# Patient Record
Sex: Female | Born: 1940 | Race: White | Hispanic: No | Marital: Married | Smoking: Former smoker
Health system: Southern US, Community
[De-identification: ages and names within clinical notes are randomized; demographics above are authoritative.]

## PROBLEM LIST (undated history)

## (undated) DIAGNOSIS — E7800 Pure hypercholesterolemia, unspecified: Secondary | ICD-10-CM

## (undated) DIAGNOSIS — H269 Unspecified cataract: Secondary | ICD-10-CM

## (undated) DIAGNOSIS — C801 Malignant (primary) neoplasm, unspecified: Secondary | ICD-10-CM

## (undated) DIAGNOSIS — M1990 Unspecified osteoarthritis, unspecified site: Secondary | ICD-10-CM

## (undated) DIAGNOSIS — I10 Essential (primary) hypertension: Secondary | ICD-10-CM

## (undated) DIAGNOSIS — K219 Gastro-esophageal reflux disease without esophagitis: Secondary | ICD-10-CM

## (undated) DIAGNOSIS — T7840XA Allergy, unspecified, initial encounter: Secondary | ICD-10-CM

## (undated) HISTORY — DX: Gastro-esophageal reflux disease without esophagitis: K21.9

## (undated) HISTORY — DX: Malignant (primary) neoplasm, unspecified: C80.1

## (undated) HISTORY — DX: Allergy, unspecified, initial encounter: T78.40XA

## (undated) HISTORY — PX: KNEE SURGERY: SHX244

## (undated) HISTORY — PX: BREAST SURGERY: SHX581

## (undated) HISTORY — PX: ABDOMINAL HYSTERECTOMY: SHX81

## (undated) HISTORY — DX: Unspecified cataract: H26.9

## (undated) HISTORY — DX: Unspecified osteoarthritis, unspecified site: M19.90

---

## 2008-11-23 ENCOUNTER — Ambulatory Visit: Payer: Self-pay | Admitting: Family Medicine

## 2008-11-23 DIAGNOSIS — N3941 Urge incontinence: Secondary | ICD-10-CM | POA: Insufficient documentation

## 2008-11-23 DIAGNOSIS — Z780 Asymptomatic menopausal state: Secondary | ICD-10-CM | POA: Insufficient documentation

## 2008-11-23 DIAGNOSIS — E041 Nontoxic single thyroid nodule: Secondary | ICD-10-CM | POA: Insufficient documentation

## 2008-11-24 ENCOUNTER — Encounter: Payer: Self-pay | Admitting: Family Medicine

## 2008-11-24 LAB — CONVERTED CEMR LAB
ALT: 16 units/L (ref 0–35)
AST: 20 units/L (ref 0–37)
Albumin: 4.5 g/dL (ref 3.5–5.2)
Alkaline Phosphatase: 62 units/L (ref 39–117)
BUN: 19 mg/dL (ref 6–23)
CO2: 24 meq/L (ref 19–32)
Calcium: 10.4 mg/dL (ref 8.4–10.5)
Chloride: 104 meq/L (ref 96–112)
Cholesterol: 211 mg/dL — ABNORMAL HIGH (ref 0–200)
Creatinine, Ser: 0.86 mg/dL (ref 0.40–1.20)
Glucose, Bld: 90 mg/dL (ref 70–99)
HDL: 53 mg/dL (ref >39)
Potassium: 4.1 meq/L (ref 3.5–5.3)
Sodium: 142 meq/L (ref 135–145)
TSH: 1.687 microintl units/mL (ref 0.350–4.500)
Total Bilirubin: 0.5 mg/dL (ref 0.3–1.2)
Total CHOL/HDL Ratio: 4
Total Protein: 7.1 g/dL (ref 6.0–8.3)
Triglycerides: 417 mg/dL — ABNORMAL HIGH (ref <150)

## 2008-11-27 LAB — CONVERTED CEMR LAB: Direct LDL: 111 mg/dL — ABNORMAL HIGH

## 2008-12-01 ENCOUNTER — Encounter: Admission: RE | Admit: 2008-12-01 | Discharge: 2008-12-01 | Payer: Self-pay | Admitting: Family Medicine

## 2009-01-15 ENCOUNTER — Telehealth (INDEPENDENT_AMBULATORY_CARE_PROVIDER_SITE_OTHER): Payer: Self-pay | Admitting: *Deleted

## 2009-03-05 ENCOUNTER — Telehealth: Payer: Self-pay | Admitting: Family Medicine

## 2009-03-14 ENCOUNTER — Encounter: Admission: RE | Admit: 2009-03-14 | Discharge: 2009-03-14 | Payer: Self-pay | Admitting: Family Medicine

## 2010-02-14 NOTE — Assessment & Plan Note (Signed)
Summary: NOV: CPE   Vital Signs:  Patient profile:   70 year old Kendra [name] Height:      66.2 inches Weight:      155 pounds BMI:     24.96 Pulse rate:   85 / minute BP sitting:   122 / 77  (left arm) Cuff size:   regular  Vitals Entered By: Kathlene November (November 23, 2008 8:44 AM) CC: Np- CPE needs refills Is Patient Diabetic? No   CC:  Np- CPE needs refills.  History of Present Illness: TAking OTC fish oil a day for 2 weeks because can't afford the lovaza.  Also taking citrical two times a day in additon to 4000iu of vitamin D.   Habits & Providers  Alcohol-Tobacco-Diet     Alcohol drinks/day: 1     Tobacco Status: quit     Year Quit: 35 years ago  Exercise-Depression-Behavior     Does Patient Exercise: no     STD Risk: never     Drug Use: no     Seat Belt Use: always  Current Medications (verified): 1)  Losartan Potassium-Hctz 100-25 Mg Tabs (Losartan Potassium-Hctz) .... Take One Tablet By Mouth Once A Day 2)  Lipitor 40 Mg Tabs (Atorvastatin Calcium) .... Take One Tablet By Mouth Once Aday 3)  Lovaza 1 Gm Caps (Omega-3-Acid Ethyl Esters) .... Take One Tablet By Mouth Once A Day 4)  Claritin 10 Mg Tabs (Loratadine) .... Take One Tablet By Mouth Once A Day 5)  Pepcid 20 Mg Tabs (Famotidine) .... Take One Tablet By Mouth Once A Day 6)  Citracal Plus  Tabs (Multiple Minerals-Vitamins) .... Take One Tablet By Mouth Twice A Day  Allergies (verified): 1)  ! Morphine  Comments:  Nurse/Medical Assistant: The patient's medications and allergies were reviewed with the patient and were updated in the Medication and Allergy Lists. Kathlene November (November 23, 2008 8:49 AM)  Past History:  Past Medical History: Hearing loss wears bilat hearing aids.   Past Surgical History: Left  knee surgery, Torn ACL Complet3e Hysterectomy 1995, for uterine polyps Right ankle surgery   Family History: Father with colon Ca, Sister wtih BrCa Mother with MI, DM Father with  stroke  Social History: [employer].  Assoc degree.  Married to [employer] with one daughter.   Former Smoker Alcohol use-yes, 1-2 glasses of wine per day.  Drug use-no Regular exercise-no No caffeine. Smoking Status:  quit Does Patient Exercise:  no STD Risk:  never Drug Use:  no Seat Belt Use:  always  Review of Systems       No fever/sweats/weakness, unexplained weight loss/gain.  No vison changes.  No difficulty hearing/ringing in ears, hay fever/allergies.  No chest pain/discomfort, palpitations.  No Br lump/nipple discharge.  No cough/wheeze.  No blood in BM, nausea/vomiting/diarrhea.  No nighttime urination, leaking urine, unusual vaginal bleeding, discharge (penis or vagina).  No muscle/joint pain. No rash, change in mole.  No HA, memory loss.  No anxiety, sleep d/o, depression.  No easy bruising/bleeding, unexplained lump   Physical Exam  General:  Well-developed,well-nourished,in no acute distress; alert,appropriate and cooperative throughout examination Head:  Normocephalic and atraumatic without obvious abnormalities. No apparent alopecia or balding. Eyes:  No corneal or conjunctival inflammation noted. EOMI. Perrla.WEars glasses.  Ears:  External ear exam shows no significant lesions or deformities.  Otoscopic examination reveals clear canals, tympanic membranes are intact bilaterally without bulging, retraction, inflammation or discharge. Hearing is grossly normal bilaterally. Nose:  External nasal examination shows no  deformity or inflammation. Nasal mucosa are pink and moist without lesions or exudates. Mouth:  Oral mucosa and oropharynx without lesions or exudates.  Teeth in good repair. Neck:  No deformities, masses, or tenderness noted. I feel a nodule on the right side of the thyroid gland near the middle.  Nontender.  Chest Wall:  No deformities, masses, or tenderness noted. Breasts:  No mass, nodules, thickening, tenderness, bulging, retraction, inflamation, nipple discharge  or skin changes noted.   Lungs:  Normal respiratory effort, chest expands symmetrically. Lungs are clear to auscultation, no crackles or wheezes. Heart:  Normal rate and regular rhythm. S1 and S2 normal without gallop, murmur, click, rub or other extra sounds. Abdomen:  Bowel sounds positive,abdomen soft and non-tender without masses, organomegaly or hernias noted. Msk:  No deformity or scoliosis noted of thoracic or lumbar spine.   Pulses:  R and L carotid,radial,dorsalis pedis and posterior tibial pulses are full and equal bilaterally Extremities:  No clubbing, cyanosis, edema, or deformity noted with normal full range of motion of all joints.   Neurologic:  No cranial nerve deficits noted. Station and gait are normal. DTRs are symmetrical throughout. Sensory, motor and coordinative functions appear intact. Skin:  no rashes.   Cervical Nodes:  No lymphadenopathy noted Axillary Nodes:  No palpable lymphadenopathy Psych:  Cognition and judgment appear intact. Alert and cooperative with normal attention span and concentration. No apparent delusions, illusions, hallucinations   Impression & Recommendations:  Problem # 1:  HEALTH MAINTENANCE EXAM (ICD-V70.0) Due for DEXA and mammogram. Order form given. Labs slip given for screening labs.  Can decrease her vitamin D intake to 2000iu once a day as this really should be more than adequate.  Continue her calcium.  She has already had her flu shot. Pneumococcal given today. We should get shingles in Jan and can update her at that point in time.  Encourage more regularly exercise.  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  Orders: T-TSH [phone]) T-*Unlisted Diagnostic X-ray test/procedure [phone])  Complete Medication List: 1)  Losartan Potassium-hctz 100-25 Mg Tabs (Losartan potassium-hctz) ....  Take one tablet by mouth once a day 2)  Lipitor 40 Mg Tabs (Atorvastatin calcium) .... Take one tablet by mouth once aday 3)  Lovaza 1 Gm Caps (Omega-3-acid ethyl esters) .... Take 4 tablet by mouth once a day 4)  Claritin 10 Mg Tabs (Loratadine) .... Take one tablet by mouth once a day 5)  Pepcid 20 Mg Tabs (Famotidine) .... Take one tablet by mouth once a day 6)  Citracal Plus Tabs (Multiple minerals-vitamins) .... Take one tablet by mouth twice a day  Other Orders: T-Dual DXA Bone Density/ Axial (96295) Pneumococcal Vaccine (28413) Admin 1st Vaccine (24401) Urology Referral (Urology) Prescriptions: LOVAZA 1 GM CAPS (OMEGA-3-ACID ETHYL ESTERS) take 4 tablet by mouth once a day  #360 x 1   Entered and Authorized by:   Nani Gasser MD   Signed by:   Nani Gasser MD on 11/23/2008   Method used:   Electronically to        [facility]  [address] (retail)       [address]       [place], [place]  [address]       Ph: [phone]       Fax: [phone]   RxID:   [id number] LIPITOR 40 MG TABS (ATORVASTATIN  CALCIUM) Take one tablet by mouth once aday  #90 x 1   Entered and Authorized by:   Nani Gasser MD   Signed by:   Nani Gasser MD on 11/23/2008   Method used:   Electronically to        [facility]  [address] (retail)       [address]       [place], [place]  [address]       Ph: [phone]       Fax: [phone]   RxID:   [id number] LOSARTAN POTASSIUM-HCTZ 100-25 MG TABS (LOSARTAN POTASSIUM-HCTZ) take one tablet by mouth once a day  #90 x 1   Entered and Authorized by:   Nani Gasser MD   Signed by:   Nani Gasser MD on 11/23/2008   Method used:   Electronically to        [facility]  [address] (retail)       [address]       [place], [place]  [address]       Ph: [phone]       Fax: [phone]   RxID:   [id number]   Flu Vaccine Result Date:  10/22/2008 Flu Vaccine Result:  given Flu Vaccine Next Due:  1  yr PAP Next Due:  Not Indicated   Immunizations Administered:  Pneumonia Vaccine:    Vaccine Type: Pneumovax    Site: right deltoid    Mfr: Merck    Dose: 0.5 ml    Route: IM    Given by: Kathlene November    Exp. Date: 12/23/2009    Lot #: [id number]    VIS given: 08/11/95 version given November 23, 2008.

## 2010-02-14 NOTE — Progress Notes (Signed)
Summary: Due for repeat US thyroid  Phone Note Outgoing Call   Summary of Call: Call pt: Due for repeat thyroid US> Initial call taken by: Nani Gasser MD,  March 05, 2009 10:54 AM  Follow-up for Phone Call        faxed order to [facility] to be scheduled. They will contact patient Follow-up by: Kathlene November,  March 05, 2009 11:03 AM

## 2010-02-14 NOTE — Progress Notes (Signed)
----   Converted from flag ---- ---- 01/15/2009 9:45 AM, Nani Gasser MD wrote: Can you cal pt and let her know we have the vaccines in if she wants to schedule a nurse visit to get it.   ---- 11/23/2008 9:16 AM, Nani Gasser MD wrote: Needs shingles vaccine. ------------------------------  Pt notified of above. KJ LPN 01/18/1094

## 2010-02-25 ENCOUNTER — Encounter: Payer: Self-pay | Admitting: Family Medicine

## 2010-02-26 ENCOUNTER — Encounter: Payer: Self-pay | Admitting: Family Medicine

## 2010-02-27 ENCOUNTER — Telehealth: Payer: Self-pay | Admitting: Family Medicine

## 2010-03-06 NOTE — Procedures (Signed)
Summary: Colonoscopy    Preventive Care Screening  Colonoscopy:    Date:  02/25/2010    Next Due:  02/2015    Results:  normal Performed at [facility]

## 2010-03-06 NOTE — Progress Notes (Signed)
Summary: Neds appt  ---- Converted from flag ---- ---- 02/27/2010 11:07 AM, Janeal Holmes wrote: Dr. Judie Petit,  I called this pt yesterday and left messg with husband for pt to call back and discuss appointment.  Thanks, [place]  ---- 02/26/2010 11:13 AM, Nani Gasser MD wrote: Needs appt. Hasn't been seen in over a year. ------------------------------

## 2010-03-19 ENCOUNTER — Encounter: Payer: Self-pay | Admitting: Family Medicine

## 2010-03-21 NOTE — Procedures (Signed)
Summary: Colonoscopy/[facility]  Colonoscopy/[facility]   Imported By: Lanelle Bal 03/14/2010 12:53:24  _____________________________________________________________________  External Attachment:    Type:   Image     Comment:   External Document

## 2010-03-26 ENCOUNTER — Encounter: Payer: Self-pay | Admitting: Family Medicine

## 2010-03-26 ENCOUNTER — Encounter (INDEPENDENT_AMBULATORY_CARE_PROVIDER_SITE_OTHER): Payer: Commercial Indemnity | Admitting: Family Medicine

## 2010-03-26 DIAGNOSIS — Z0000 Encounter for general adult medical examination without abnormal findings: Secondary | ICD-10-CM

## 2010-03-27 ENCOUNTER — Encounter: Payer: Self-pay | Admitting: Family Medicine

## 2010-03-28 LAB — CONVERTED CEMR LAB
ALT: 12 units/L (ref 0–35)
AST: 17 units/L (ref 0–37)
Albumin: 4.7 g/dL (ref 3.5–5.2)
Alkaline Phosphatase: 68 units/L (ref 39–117)
BUN: 16 mg/dL (ref 6–23)
CO2: 24 meq/L (ref 19–32)
Calcium: 9.5 mg/dL (ref 8.4–10.5)
Chloride: 102 meq/L (ref 96–112)
Cholesterol: 203 mg/dL — ABNORMAL HIGH (ref 0–200)
Creatinine, Ser: 0.79 mg/dL (ref 0.40–1.20)
Glucose, Bld: 91 mg/dL (ref 70–99)
HDL: 60 mg/dL (ref >39)
LDL Cholesterol: 102 mg/dL — ABNORMAL HIGH (ref 0–99)
Potassium: 3.7 meq/L (ref 3.5–5.3)
Sodium: 140 meq/L (ref 135–145)
TSH: 1.278 microintl units/mL (ref 0.350–4.500)
Total Bilirubin: 0.6 mg/dL (ref 0.3–1.2)
Total CHOL/HDL Ratio: 3.4
Total Protein: 7.5 g/dL (ref 6.0–8.3)
Triglycerides: 206 mg/dL — ABNORMAL HIGH (ref <150)
VLDL: 41 mg/dL — ABNORMAL HIGH (ref 0–40)

## 2010-04-01 ENCOUNTER — Telehealth: Payer: Self-pay | Admitting: Family Medicine

## 2010-04-02 NOTE — Assessment & Plan Note (Signed)
Summary: CPE   Vital Signs:  Patient profile:   70 year old female Height:      66.2 inches Weight:      155 pounds Pulse rate:   59 / minute BP sitting:   127 / 83  (right arm) Cuff size:   regular  Vitals Entered By: Avon Gully CMA, Duncan Dull) (March 26, 2010 11:07 AM) CC: CPE, no pap, would like paper rx   CC:  CPE, no pap, and would like paper rx.  History of Present Illness:  she is here for a complete physical without Pap.  Current Medications (verified): 1)  Losartan Potassium-Hctz 100-25 Mg Tabs (Losartan Potassium-Hctz) .... Take One Tablet By Mouth Once A Day 2)  Lipitor 40 Mg Tabs (Atorvastatin Calcium) .... Take One and One-Half Tablet By Mouth Once A Day 3)  Lovaza 1 Gm Caps (Omega-3-Acid Ethyl Esters) .... Take 4 Tablet By Mouth Once A Day 4)  Zyrtec Allergy 10 Mg Tabs (Cetirizine Hcl) .... Take One Table By Mouth Once A Day 5)  Pepcid 20 Mg Tabs (Famotidine) .... Take One Tablet By Mouth Once A Day 6)  Citracal Plus  Tabs (Multiple Minerals-Vitamins) .... Take One Tablet By Mouth Twice A Day  Allergies (verified): 1)  ! Morphine  Comments:  Nurse/Medical Assistant: The patient's medications and allergies were reviewed with the patient and were updated in the Medication and Allergy Lists. Avon Gully CMA, Duncan Dull) (March 26, 2010 11:08 AM)  Past History:  Past Medical History: Last updated: 11/23/2008 Hearing loss wears bilat hearing aids.   Past Surgical History: Last updated: 11/23/2008 Left  knee surgery, Torn ACL Complet3e Hysterectomy 1995, for uterine polyps Right ankle surgery   Family History: Father with colon Ca, lung Ca, COPD, stroke, living.  Sister wtih BrCa Mother with MI, DM, alzheimers died  Review of Systems  The patient denies anorexia, fever, weight loss, weight gain, vision loss, decreased hearing, hoarseness, chest pain, syncope, dyspnea on exertion, peripheral edema, prolonged cough, headaches, hemoptysis, abdominal  pain, melena, hematochezia, severe indigestion/heartburn, hematuria, incontinence, genital sores, muscle weakness, suspicious skin lesions, transient blindness, difficulty walking, depression, unusual weight change, abnormal bleeding, enlarged lymph nodes, and breast masses.    Physical Exam  General:  Well-developed,well-nourished,in no acute distress; alert,appropriate and cooperative throughout examination Head:  Normocephalic and atraumatic without obvious abnormalities. No apparent alopecia or balding. Eyes:  No corneal or conjunctival inflammation noted. EOMI. Perrla. Ears:  External ear exam shows no significant lesions or deformities.  Otoscopic examination reveals clear canals, tympanic membranes are intact bilaterally without bulging, retraction, inflammation or discharge. Hearing is grossly normal bilaterally. Nose:  External nasal examination shows no deformity or inflammation.  Mouth:  Oral mucosa and oropharynx without lesions or exudates.  Teeth in good repair. Neck:  No deformities, masses, or tenderness noted. Chest Wall:  No deformities, masses, or tenderness noted. Breasts:  No mass, nodules, thickening, tenderness, bulging, retraction, inflamation, nipple discharge or skin changes noted.   Lungs:  Normal respiratory effort, chest expands symmetrically. Lungs are clear to auscultation, no crackles or wheezes. Heart:  Normal rate and regular rhythm. S1 and S2 normal without gallop, murmur, click, rub or other extra sounds. Abdomen:  Bowel sounds positive,abdomen soft and non-tender without masses, organomegaly or hernias noted. Msk:  No deformity or scoliosis noted of thoracic or lumbar spine.   Pulses:  R and L carotid,radial,dorsalis pedis and posterior tibial pulses are full and equal bilaterally Extremities:  No clubbing, cyanosis, edema, or deformity noted  with normal full range of motion of all joints.   Neurologic:  No cranial nerve deficits noted. Station and gait are  normal. DTRs are symmetrical throughout. Sensory, motor and coordinative functions appear intact. Skin:  no rashes.   Cervical Nodes:  No lymphadenopathy noted Psych:  Cognition and judgment appear intact. Alert and cooperative with normal attention span and concentration. No apparent delusions, illusions, hallucinations   Impression & Recommendations:  Problem # 1:  HEALTH MAINTENANCE EXAM (ICD-V70.0) Exam is normal today Encouraged regular exercise.  Has scheduled bone density and mammogram for next week Given Tdap today Will give rx of for shingles vaccines.  REminded to take her calcium supplement.  Orders: T-Mammography Bilateral Screening ([address]109) T-Dual DXA Bone Density/ Axial (60454) T-Comprehensive Metabolic Panel (09811-91478) T-Lipid Profile (29562-13086) T-TSH [phone])  Has scheduled bone density and mammogram for next week Given Tdap today Will give rx of for shingles vaccines.  REminded to take her calcium supplement.  Orders: T-Mammography Bilateral Screening (28413) T-Dual DXA Bone Density/ Axial (24401) T-Comprehensive Metabolic Panel (02725-36644) T-Lipid Profile (03474-25956) T-TSH (38756-43329)  Complete Medication List: 1)  Losartan Potassium-hctz 100-25 Mg Tabs (Losartan potassium-hctz) .... Take one tablet by mouth once a day 2)  Lipitor 40 Mg Tabs (Atorvastatin calcium) .... Take one and one-half tablet by mouth once a day 3)  Lovaza 1 Gm Caps (Omega-3-acid ethyl esters) .... Take 4 tablet by mouth once a day 4)  Zyrtec Allergy 10 Mg Tabs (Cetirizine hcl) .... Take one table by mouth once a day 5)  Pepcid 20 Mg Tabs (Famotidine) .... Take one tablet by mouth once a day 6)  Citracal Plus Tabs (Multiple minerals-vitamins) .... Take one tablet by mouth twice a day 7)  Fluticasone Propionate 50 Mcg/act Susp (Fluticasone propionate) .Marland Kitchen.. 1 sprays in each nostril once a day. 8)  Zostavax 51884 Unt/0.24ml Solr (Zoster vaccine live) .... Inject [place] x  1  Patient Instructions: 1)  It is important that you exercise reguarly at least 20 minutes 5 times a week. If you develop chest pain, have severe difficulty breathing, or feel very tired, stop exercising immediately and seek medical attention.  2)  Take calcium +vitamin D daily.  3)  We will call you with your lab results.  Prescriptions: ZOSTAVAX [id number] UNT/0.65ML SOLR (ZOSTER VACCINE LIVE) inject [place] x 1  #1 x 0   Entered and Authorized by:   Nani Gasser MD   Signed by:   Nani Gasser MD on 03/26/2010   Method used:   Print then Give to Patient   RxID:   [id number] LOVAZA 1 GM CAPS (OMEGA-3-ACID ETHYL ESTERS) take 4 tablet by mouth once a day  #360 x 3   Entered and Authorized by:   Nani Gasser MD   Signed by:   Nani Gasser MD on 03/26/2010   Method used:   Print then Give to Patient   RxID:   [id number] LIPITOR 40 MG TABS (ATORVASTATIN CALCIUM) Take one and one-half tablet by mouth once a day  #90 x 3   Entered and Authorized by:   Nani Gasser MD   Signed by:   Nani Gasser MD on 03/26/2010   Method used:   Print then Give to Patient   RxID:   [id number] RSWNIOEV POTASSIUM-HCTZ 100-25 MG TABS (LOSARTAN POTASSIUM-HCTZ) take one tablet by mouth once a day  #90 x 3   Entered and Authorized by:   Nani Gasser MD   Signed by:   Nani Gasser MD on 03/26/2010  Method used:   Print then Give to Patient   RxID:   [id number] FLUTICASONE PROPIONATE 50 MCG/ACT SUSP (FLUTICASONE PROPIONATE) 1 sprays in each nostril once a day.  #3 x 3   Entered and Authorized by:   Nani Gasser MD   Signed by:   Nani Gasser MD on 03/26/2010   Method used:   Print then Give to Patient   RxID:   [id number]    Orders Added: 1)  T-Mammography Bilateral Screening [77057] 2)  T-Dual DXA Bone Density/ Axial [77080] 3)  T-Comprehensive Metabolic Panel [80053-22900] 4)  T-Lipid Profile [80061-22930] 5)  T-TSH  [84696-29528] 6)  Est. Patient age 33&> [41324]      Flex Sig Next Due:  Not Indicated Hemoccult Next Due:  Not Indicated

## 2010-04-11 NOTE — Progress Notes (Signed)
  Phone Note Call from Patient   Caller: Patient Call For: Nani Gasser MD Summary of Call: pt called on Friday and wanted a rx for itchy ears. States you guys had discussed it but then forgot to send in medication Initial call taken by: Avon Gully CMA, Duncan Dull),  April 01, 2010 10:57 AM    New/Updated Medications: ACETASOL HC 2-1 % SOLN (HYDROCORTISONE-ACETIC ACID) 2 gtt in each ear two times a day for one week. Prescriptions: ACETASOL HC 2-1 % SOLN (HYDROCORTISONE-ACETIC ACID) 2 gtt in each ear two times a day for one week.  #1 bottle x 0   Entered and Authorized by:   Nani Gasser MD   Signed by:   Nani Gasser MD on 04/01/2010   Method used:   Electronically to        [facility] (retail)       [address]       [place], [place]  [address]       Ph: [phone]       Fax: [phone]   RxID:   [id number]

## 2010-06-05 ENCOUNTER — Encounter: Payer: Self-pay | Admitting: Family Medicine

## 2010-12-26 ENCOUNTER — Other Ambulatory Visit: Payer: Self-pay | Admitting: Family Medicine

## 2011-04-10 ENCOUNTER — Other Ambulatory Visit: Payer: Self-pay | Admitting: Family Medicine

## 2011-04-19 ENCOUNTER — Other Ambulatory Visit: Payer: Self-pay | Admitting: Family Medicine

## 2011-04-22 ENCOUNTER — Other Ambulatory Visit: Payer: Self-pay | Admitting: Family Medicine

## 2011-04-29 DIAGNOSIS — J019 Acute sinusitis, unspecified: Secondary | ICD-10-CM | POA: Diagnosis not present

## 2011-04-29 DIAGNOSIS — R059 Cough, unspecified: Secondary | ICD-10-CM | POA: Diagnosis not present

## 2011-04-29 DIAGNOSIS — R05 Cough: Secondary | ICD-10-CM | POA: Diagnosis not present

## 2011-04-29 DIAGNOSIS — J309 Allergic rhinitis, unspecified: Secondary | ICD-10-CM | POA: Diagnosis not present

## 2011-04-30 ENCOUNTER — Other Ambulatory Visit: Payer: Self-pay | Admitting: Family Medicine

## 2011-05-01 ENCOUNTER — Telehealth: Payer: Self-pay | Admitting: *Deleted

## 2011-05-01 MED ORDER — FLUCONAZOLE 150 MG PO TABS: 150.0000 mg | ORAL_TABLET | Freq: Once | ORAL | Status: AC

## 2011-05-01 NOTE — Telephone Encounter (Signed)
Pt [facility] and [facility] a [facility] infection and was given abx. Pt states that she is going out of town for a month and would like to know if we can send her a diflucan to the pharmacy.

## 2011-05-01 NOTE — Telephone Encounter (Signed)
Pt informed and rx sent to pharmacy 

## 2011-05-01 NOTE — Telephone Encounter (Signed)
OK for diflucan 150mg  x 1. 0 RF. Try to f/u when back in town. She hasn't been seen in over a year.

## 2011-07-16 DIAGNOSIS — H903 Sensorineural hearing loss, bilateral: Secondary | ICD-10-CM | POA: Diagnosis not present

## 2011-07-19 ENCOUNTER — Other Ambulatory Visit: Payer: Self-pay | Admitting: Family Medicine

## 2011-07-21 NOTE — Telephone Encounter (Signed)
Must make appointment 

## 2011-08-11 ENCOUNTER — Other Ambulatory Visit: Payer: Self-pay | Admitting: Family Medicine

## 2011-08-11 NOTE — Telephone Encounter (Signed)
Must schedule office visit

## 2011-08-12 ENCOUNTER — Encounter: Payer: Self-pay | Admitting: Family Medicine

## 2011-08-12 ENCOUNTER — Ambulatory Visit (INDEPENDENT_AMBULATORY_CARE_PROVIDER_SITE_OTHER): Payer: Medicare Other | Admitting: Family Medicine

## 2011-08-12 VITALS — BP 124/83 | HR 63 | Ht 64.0 in | Wt 156.0 lb

## 2011-08-12 DIAGNOSIS — M2550 Pain in unspecified joint: Secondary | ICD-10-CM

## 2011-08-12 DIAGNOSIS — E042 Nontoxic multinodular goiter: Secondary | ICD-10-CM | POA: Insufficient documentation

## 2011-08-12 DIAGNOSIS — M79609 Pain in unspecified limb: Secondary | ICD-10-CM

## 2011-08-12 DIAGNOSIS — I10 Essential (primary) hypertension: Secondary | ICD-10-CM

## 2011-08-12 DIAGNOSIS — Z0000 Encounter for general adult medical examination without abnormal findings: Secondary | ICD-10-CM

## 2011-08-12 DIAGNOSIS — M25579 Pain in unspecified ankle and joints of unspecified foot: Secondary | ICD-10-CM

## 2011-08-12 DIAGNOSIS — M79643 Pain in unspecified hand: Secondary | ICD-10-CM

## 2011-08-12 DIAGNOSIS — E785 Hyperlipidemia, unspecified: Secondary | ICD-10-CM | POA: Diagnosis not present

## 2011-08-12 DIAGNOSIS — E041 Nontoxic single thyroid nodule: Secondary | ICD-10-CM | POA: Diagnosis not present

## 2011-08-12 NOTE — Progress Notes (Addendum)
Subjective:    Kendra [name] is a 71 y.o. female who presents for Medicare Annual/Subsequent preventive examination.  Preventive Screening-Counseling & Management  Tobacco History  Smoking status  . Former Smoker  Smokeless tobacco  . Not on file  Comment: quit 30 years ago     Problems Prior to Visit 1.   Current Problems (verified) Patient Active Problem List  Diagnosis  . THYROID NODULE  . INCONTINENCE, URGE  . POSTMENOPAUSAL STATUS    Medications Prior to Visit Current Outpatient Prescriptions on File Prior to Visit  Medication Sig Dispense Refill  . atorvastatin (LIPITOR) 40 MG tablet TAKE 1 AND 1/2 TABLET BY MOUTH ONCE DAILY  45 tablet  0  . CALCIUM PO Take by mouth.      . Famotidine (PEPCID PO) Take by mouth.      . fluticasone (FLONASE) 50 MCG/ACT nasal spray INHALE 1 SPRAY IN EACH NOSTRIL ONCE DAILY  48 g  2  . losartan-hydrochlorothiazide (HYZAAR) 100-25 MG per tablet TAKE 1 TABLET BY MOUTH EVERY DAY  90 tablet  3  . LOVAZA 1 G capsule TAKE 4 CAPSULES BY MOUTH EVERY DAY  360 capsule  3    Current Medications (verified) Current Outpatient Prescriptions  Medication Sig Dispense Refill  . aspirin 81 MG tablet Take 81 mg by mouth daily.      Marland Kitchen atorvastatin (LIPITOR) 40 MG tablet TAKE 1 AND 1/2 TABLET BY MOUTH ONCE DAILY  45 tablet  0  . CALCIUM PO Take by mouth.      . Famotidine (PEPCID PO) Take by mouth.      . FIBER SELECT GUMMIES PO Take by mouth.      . fluticasone (FLONASE) 50 MCG/ACT nasal spray INHALE 1 SPRAY IN EACH NOSTRIL ONCE DAILY  48 g  2  . losartan-hydrochlorothiazide (HYZAAR) 100-25 MG per tablet TAKE 1 TABLET BY MOUTH EVERY DAY  90 tablet  3  . LOVAZA 1 G capsule TAKE 4 CAPSULES BY MOUTH EVERY DAY  360 capsule  3     Allergies (verified) Morphine   PAST HISTORY  Family History No family history on file.  Social History History  Substance Use Topics  . Smoking status: Former Games developer  . Smokeless tobacco: Not on file   Comment:  quit 30 years ago  . Alcohol Use: Not on file     Are there smokers in your home (other than you)? No  Risk Factors Current exercise habits: walks 3 days per week  Dietary issues discussed: no concerns    Cardiac risk factors: advanced age (older than 96 for men, 81 for women) and obesity (BMI >= 30 kg/m2).  Depression Screen (Note: if answer to either of the following is "Yes", a more complete depression screening is indicated)   Over the past two weeks, have you felt down, depressed or hopeless? No  Over the past two weeks, have you felt little interest or pleasure in doing things? No  Have you lost interest or pleasure in daily life? No  Do you often feel hopeless? No  Do you cry easily over simple problems? No  Activities of Daily Living In your present state of health, do you have any difficulty performing the following activities?:  Driving? No Managing money?  No Feeding yourself? No Getting from bed to chair? No Climbing a flight of stairs? No Preparing food and eating?: No Bathing or showering? No Getting dressed: No Getting to the toilet? No Using the toilet:No Moving around  from place to place: No In the past year have you fallen or had a near fall?:No   Are you sexually active?  Yes  Do you have more than one partner?  No  Hearing Difficulties: Yes Do you often ask people to speak up or repeat themselves? Yes Do you experience ringing or noises in your ears? No Do you have difficulty understanding soft or whispered voices? Yes   Do you feel that you have a problem with memory? No  Do you often misplace items? No  Do you feel safe at home?  Yes  Cognitive Testing  Alert? Yes  Normal Appearance?Yes  Oriented to person? Yes  Place? Yes   Time? Yes  Recall of three objects?  Yes  Can perform simple calculations? Yes  Displays appropriate judgment?Yes  Can read the correct time from a watch face?Yes   Advanced Directives have been discussed with the  patient? Yes  List the Names of Other Physician/Practitioners you currently use: 1.  Dr. Noe Gens for the colonoscopy.   Indicate any recent Medical Services you may have received from other than [facility] providers in the past year (date may be approximate).  Immunization History  Administered Date(s) Administered  . Influenza Whole 10/22/2008  . Pneumococcal Polysaccharide 11/23/2008    Screening Tests Health Maintenance  Topic Date Due  . Influenza Vaccine  10/14/2011  . Mammogram  04/30/2012  . Tetanus/tdap  01/14/2020  . Colonoscopy  02/14/2020  . Pneumococcal Polysaccharide Vaccine Age 68 And Over  Completed  . Zostavax  Addressed    All answers were reviewed with the patient and necessary referrals were made:  Kendra Cozzolino, MD   08/12/2011   History reviewed: allergies, current medications, past family history, past medical history, past social history, past surgical history and problem list  Review of Systems A comprehensive review of systems was negative.    Objective:     Vision by Snellen chart: right eye:20/30, left eye:20/30  Body mass index is 26.78 kg/(m^2). BP 124/83  Pulse 63  Ht 5\' 4"  (1.626 m)  Wt 156 lb (70.761 kg)  BMI 26.78 kg/m2  BP 124/83  Pulse 63  Ht 5\' 4"  (1.626 m)  Wt 156 lb (70.761 kg)  BMI 26.78 kg/m2  General Appearance:    Alert, cooperative, no distress, appears stated age  Head:    Normocephalic, without obvious abnormality, atraumatic  Eyes:    PERRL, conjunctiva/corneas clear, EOM's intact, both eyes  Ears:    Normal TM's and external ear canals, both ears  Nose:   Nares normal, septum midline, mucosa normal, no drainage    or sinus tenderness  Throat:   Lips, mucosa, and tongue normal; teeth and gums normal  Neck:   Supple, symmetrical, trachea midline, no adenopathy;    thyroid:  Thyroid is slightly larger on the right, with a palpable nodule; no carotid bruits or JVD  Back:     Symmetric, no curvature, ROM normal, no CVA  tenderness  Lungs:     Clear to auscultation bilaterally, respirations unlabored  Chest Wall:    No tenderness or deformity   Heart:    Regular rate and rhythm, S1 and S2 normal, no murmur, rub   or gallop  Breast Exam:    No tenderness, masses, or nipple abnormality  Abdomen:     Soft, non-tender, bowel sounds active all four quadrants,    no masses, no organomegaly  Genitalia:    Not preformed  Rectal:    Norr  performed  Extremities:   Extremities normal, atraumatic, no cyanosis or edema  Pulses:   2+ and symmetric all extremities  Skin:   Skin color, texture, turgor normal, no rashes or lesions  Lymph nodes:   Cervical, supraclavicular, and axillary nodes normal  Neurologic:   CNII-XII intact, normal strength, sensation and reflexes    throughout       Assessment:     Annual Wellness Exam, Medicare     Plan:     During the course of the visit the patient was educated and counseled about appropriate screening and preventive services including:    Screening mammography - she has scheduled in August.  Hearing Loss - Already wears hearing aids.    Vaccines up to date.   Diet review for nutrition referral? Yes ____  Not Indicated __x__   Patient Instructions (the written plan) was given to the patient.  Medicare Attestation I have personally reviewed: The patient's medical and social history Their use of alcohol, tobacco or illicit drugs Their current medications and supplements The patient's functional ability including ADLs,fall risks, home safety risks, cognitive, and hearing and visual impairment Diet and physical activities Evidence for depression or mood disorders  The patient's weight, height, BMI, and visual acuity have been recorded in the chart.  I have made referrals, counseling, and provided education to the patient based on review of the above and I have provided the patient with a written personalized care plan for preventive services.      Cruzito Standre, MD   08/12/2011       Right hand pain  - She also complains of pain especially in her right hand. She has some swelling over the MCPs. She says a couple years ago she had a doctor tell her that looked similar to rheumatoid but says that when they worked it up it was not rheumatoid. She says that was at least 10 years ago. She does have some significant swelling over the second and third MCPs. Some slight bogginess as well as tenderness. She says she typically has no problems with her left hand. I would like to check an ANA, rheumatoid factor and CBC today. We will also get x-rays.  Right ankle pain - She also has pain in her right ankle. She said she had a spiral fracture back in 1995 and once in a cast up to her hip at that time. She says the last several years she's had some pain and discomfort near the lateral malleolus. Especially right below the bone itself. She says it has been bothering her more lately and she has noticed an increase in swelling in that area. She would also like to get an x-ray of this area just to see if there is any significant changes or arthritis and the foot. She tries to rest and elevate it when it bothers her and this does seem to help. She does not usually use pain relievers for it.  Thyroid nodules history multinodular goiter-on exam today the right ureter does fill slightly larger so would like to get a repeat ultrasound since her last was 2 years ago just to make sure the lesions themselves are stable. We'll also check a TSH.  Hypertension-well-controlled. Due for CMP and fasting lipid panel today.

## 2011-08-12 NOTE — Patient Instructions (Addendum)
Start a regular exercise program and make sure you are eating a healthy diet Try to eat 4 servings of dairy a day Your vaccines are up to date.  We will call you with your lab results. If you don't here from [place] in about a week then please give [place] a call at [phone]. We will call you with your xray results too.

## 2011-08-13 ENCOUNTER — Ambulatory Visit (INDEPENDENT_AMBULATORY_CARE_PROVIDER_SITE_OTHER): Payer: Medicare Other

## 2011-08-13 DIAGNOSIS — M25579 Pain in unspecified ankle and joints of unspecified foot: Secondary | ICD-10-CM

## 2011-08-13 DIAGNOSIS — M19049 Primary osteoarthritis, unspecified hand: Secondary | ICD-10-CM | POA: Diagnosis not present

## 2011-08-13 DIAGNOSIS — R29898 Other symptoms and signs involving the musculoskeletal system: Secondary | ICD-10-CM

## 2011-08-13 DIAGNOSIS — M79643 Pain in unspecified hand: Secondary | ICD-10-CM

## 2011-08-13 DIAGNOSIS — M19079 Primary osteoarthritis, unspecified ankle and foot: Secondary | ICD-10-CM | POA: Diagnosis not present

## 2011-08-13 LAB — COMPLETE METABOLIC PANEL WITH GFR
ALT: 16 U/L (ref 0–35)
AST: 22 U/L (ref 0–37)
Albumin: 4.3 g/dL (ref 3.5–5.2)
Alkaline Phosphatase: 70 U/L (ref 39–117)
BUN: 15 mg/dL (ref 6–23)
CO2: 27 mEq/L (ref 19–32)
Calcium: 10.1 mg/dL (ref 8.4–10.5)
Chloride: 105 mEq/L (ref 96–112)
Creat: 0.85 mg/dL (ref 0.50–1.10)
GFR, Est African American: 81 mL/min
GFR, Est Non African American: 71 mL/min
Glucose, Bld: 93 mg/dL (ref 70–99)
Potassium: 4.2 mEq/L (ref 3.5–5.3)
Sodium: 141 mEq/L (ref 135–145)
Total Bilirubin: 0.6 mg/dL (ref 0.3–1.2)
Total Protein: 6.9 g/dL (ref 6.0–8.3)

## 2011-08-13 LAB — CBC WITH DIFFERENTIAL/PLATELET
Basophils Absolute: 0 10*3/uL (ref 0.0–0.1)
Basophils Relative: 1 % (ref 0–1)
Eosinophils Absolute: 0.2 10*3/uL (ref 0.0–0.7)
Eosinophils Relative: 3 % (ref 0–5)
HCT: 38.6 % (ref 36.0–46.0)
Hemoglobin: 13.1 g/dL (ref 12.0–15.0)
Lymphocytes Relative: 27 % (ref 12–46)
Lymphs Abs: 1.3 10*3/uL (ref 0.7–4.0)
MCH: 32.3 pg (ref 26.0–34.0)
MCHC: 33.9 g/dL (ref 30.0–36.0)
MCV: 95.1 fL (ref 78.0–100.0)
Monocytes Absolute: 0.4 10*3/uL (ref 0.1–1.0)
Monocytes Relative: 8 % (ref 3–12)
Neutro Abs: 2.9 10*3/uL (ref 1.7–7.7)
Neutrophils Relative %: 61 % (ref 43–77)
Platelets: 220 10*3/uL (ref 150–400)
RBC: 4.06 MIL/uL (ref 3.87–5.11)
RDW: 13.7 % (ref 11.5–15.5)
WBC: 4.8 10*3/uL (ref 4.0–10.5)

## 2011-08-13 LAB — LIPID PANEL
Cholesterol: 190 mg/dL (ref 0–200)
HDL: 67 mg/dL (ref >39)
LDL Cholesterol: 103 mg/dL — ABNORMAL HIGH (ref 0–99)
Total CHOL/HDL Ratio: 2.8 Ratio
Triglycerides: 99 mg/dL (ref <150)
VLDL: 20 mg/dL (ref 0–40)

## 2011-08-13 LAB — TSH: TSH: 1.998 u[IU]/mL (ref 0.350–4.500)

## 2011-08-13 LAB — ANA: Anti Nuclear Antibody(ANA): NEGATIVE

## 2011-08-13 LAB — URIC ACID: Uric Acid, Serum: 6.8 mg/dL (ref 2.4–7.0)

## 2011-08-13 LAB — RHEUMATOID FACTOR: Rhuematoid fact SerPl-aCnc: <10 IU/mL (ref <=14)

## 2011-08-19 ENCOUNTER — Ambulatory Visit (INDEPENDENT_AMBULATORY_CARE_PROVIDER_SITE_OTHER): Payer: Medicare Other

## 2011-08-19 DIAGNOSIS — E042 Nontoxic multinodular goiter: Secondary | ICD-10-CM

## 2011-08-19 DIAGNOSIS — E041 Nontoxic single thyroid nodule: Secondary | ICD-10-CM

## 2011-09-10 ENCOUNTER — Telehealth: Payer: Self-pay | Admitting: Family Medicine

## 2011-09-10 DIAGNOSIS — Z1231 Encounter for screening mammogram for malignant neoplasm of breast: Secondary | ICD-10-CM | POA: Diagnosis not present

## 2011-09-10 DIAGNOSIS — Z803 Family history of malignant neoplasm of breast: Secondary | ICD-10-CM | POA: Diagnosis not present

## 2011-09-10 NOTE — Telephone Encounter (Signed)
Call pt: suspicious area seen on mammo. [facility] should be contacting her for further imagina and workup.

## 2011-09-11 NOTE — Telephone Encounter (Signed)
Pt states they have already contacted her and that she is going Friday morning at 11:30am.

## 2011-09-11 NOTE — Telephone Encounter (Signed)
Left message with family member to call office back. 

## 2011-09-12 DIAGNOSIS — N6489 Other specified disorders of breast: Secondary | ICD-10-CM | POA: Diagnosis not present

## 2011-09-24 ENCOUNTER — Encounter: Payer: Self-pay | Admitting: Family Medicine

## 2011-10-08 ENCOUNTER — Encounter: Payer: Self-pay | Admitting: Family Medicine

## 2011-10-12 ENCOUNTER — Other Ambulatory Visit: Payer: Self-pay | Admitting: Family Medicine

## 2011-10-14 ENCOUNTER — Other Ambulatory Visit: Payer: Self-pay | Admitting: Family Medicine

## 2011-11-12 ENCOUNTER — Other Ambulatory Visit: Payer: Self-pay | Admitting: Family Medicine

## 2011-12-15 ENCOUNTER — Ambulatory Visit (INDEPENDENT_AMBULATORY_CARE_PROVIDER_SITE_OTHER): Payer: Medicare Other | Admitting: Family Medicine

## 2011-12-15 DIAGNOSIS — Z23 Encounter for immunization: Secondary | ICD-10-CM

## 2011-12-19 LAB — LIPID PANEL
Cholesterol: 167 mg/dL (ref 0–200)
HDL: 70 mg/dL (ref 35–70)
LDL Cholesterol: 85 mg/dL
Triglycerides: 62 mg/dL (ref 40–160)

## 2011-12-19 LAB — BASIC METABOLIC PANEL: Creatinine: 0.8 mg/dL (ref 0.5–1.1)

## 2012-02-08 ENCOUNTER — Other Ambulatory Visit: Payer: Self-pay | Admitting: Family Medicine

## 2012-02-12 ENCOUNTER — Encounter: Payer: Self-pay | Admitting: *Deleted

## 2012-03-26 DIAGNOSIS — H2510 Age-related nuclear cataract, unspecified eye: Secondary | ICD-10-CM | POA: Diagnosis not present

## 2012-04-29 ENCOUNTER — Other Ambulatory Visit: Payer: Self-pay | Admitting: Family Medicine

## 2012-05-03 ENCOUNTER — Encounter: Payer: Self-pay | Admitting: *Deleted

## 2012-05-03 NOTE — Telephone Encounter (Signed)
Must make appointment 

## 2012-05-27 ENCOUNTER — Other Ambulatory Visit: Payer: Self-pay | Admitting: Family Medicine

## 2012-05-27 NOTE — Telephone Encounter (Signed)
Must make appointment before any further refills given 

## 2012-07-01 ENCOUNTER — Other Ambulatory Visit: Payer: Self-pay | Admitting: Family Medicine

## 2012-07-30 ENCOUNTER — Other Ambulatory Visit: Payer: Self-pay | Admitting: Family Medicine

## 2012-08-05 ENCOUNTER — Encounter: Payer: Self-pay | Admitting: Family Medicine

## 2012-08-05 ENCOUNTER — Ambulatory Visit (INDEPENDENT_AMBULATORY_CARE_PROVIDER_SITE_OTHER): Payer: Medicare Other | Admitting: Family Medicine

## 2012-08-05 VITALS — BP 127/80 | HR 76 | Ht 64.6 in | Wt 160.0 lb

## 2012-08-05 DIAGNOSIS — Z0000 Encounter for general adult medical examination without abnormal findings: Secondary | ICD-10-CM

## 2012-08-05 DIAGNOSIS — Z974 Presence of external hearing-aid: Secondary | ICD-10-CM

## 2012-08-05 DIAGNOSIS — I10 Essential (primary) hypertension: Secondary | ICD-10-CM

## 2012-08-05 DIAGNOSIS — Z9889 Other specified postprocedural states: Secondary | ICD-10-CM | POA: Diagnosis not present

## 2012-08-05 DIAGNOSIS — E042 Nontoxic multinodular goiter: Secondary | ICD-10-CM | POA: Diagnosis not present

## 2012-08-05 DIAGNOSIS — E785 Hyperlipidemia, unspecified: Secondary | ICD-10-CM

## 2012-08-05 LAB — COMPLETE METABOLIC PANEL WITH GFR
ALT: 17 U/L (ref 0–35)
AST: 21 U/L (ref 0–37)
Albumin: 4.4 g/dL (ref 3.5–5.2)
Alkaline Phosphatase: 61 U/L (ref 39–117)
BUN: 15 mg/dL (ref 6–23)
CO2: 27 mEq/L (ref 19–32)
Calcium: 9.8 mg/dL (ref 8.4–10.5)
Chloride: 102 mEq/L (ref 96–112)
Creat: 0.78 mg/dL (ref 0.50–1.10)
GFR, Est African American: >89 mL/min
GFR, Est Non African American: 78 mL/min
Glucose, Bld: 96 mg/dL (ref 70–99)
Potassium: 4.2 mEq/L (ref 3.5–5.3)
Sodium: 139 mEq/L (ref 135–145)
Total Bilirubin: 0.6 mg/dL (ref 0.3–1.2)
Total Protein: 7 g/dL (ref 6.0–8.3)

## 2012-08-05 LAB — TSH: TSH: 1.457 u[IU]/mL (ref 0.350–4.500)

## 2012-08-05 NOTE — Patient Instructions (Signed)
Keep up a regular exercise program and make sure you are eating a healthy diet Try to eat 4 servings of dairy a day, or if you are lactose intolerant take a calcium with vitamin D daily.  Your vaccines are up to date.   

## 2012-08-05 NOTE — Progress Notes (Signed)
Subjective:    Kendra [name] is a 72 y.o. female who presents for Medicare Annual/Subsequent preventive examination.  Preventive Screening-Counseling & Management  Tobacco History  Smoking status  . Former Smoker  Smokeless tobacco  . Not on file    Comment: quit 30 years ago     Problems Prior to Visit 1.   Current Problems (verified) Patient Active Problem List   Diagnosis Date Noted  . Multinodular goiter 08/12/2011  . THYROID NODULE 11/23/2008  . INCONTINENCE, URGE 11/23/2008  . POSTMENOPAUSAL STATUS 11/23/2008    Medications Prior to Visit Current Outpatient Prescriptions on File Prior to Visit  Medication Sig Dispense Refill  . aspirin 81 MG tablet Take 81 mg by mouth daily.      Marland Kitchen atorvastatin (LIPITOR) 40 MG tablet TAKE 1 1/2 TABLET BY MOUTH EVERY DAY  45 tablet  0  . CALCIUM PO Take by mouth.      . Famotidine (PEPCID PO) Take by mouth.      . FIBER SELECT GUMMIES PO Take by mouth.      . fluticasone (FLONASE) 50 MCG/ACT nasal spray INHALE 1 SPRAY IN EACH NOSTRIL ONCE DAILY  48 g  2  . losartan-hydrochlorothiazide (HYZAAR) 100-25 MG per tablet TAKE 1 TABLET BY MOUTH EVERY DAY  90 tablet  3  . LOVAZA 1 G capsule TAKE 4 CAPSULES BY MOUTH EVERY DAY  360 capsule  0   No current facility-administered medications on file prior to visit.    Current Medications (verified) Current Outpatient Prescriptions  Medication Sig Dispense Refill  . aspirin 81 MG tablet Take 81 mg by mouth daily.      Marland Kitchen atorvastatin (LIPITOR) 40 MG tablet TAKE 1 1/2 TABLET BY MOUTH EVERY DAY  45 tablet  0  . CALCIUM PO Take by mouth.      . Famotidine (PEPCID PO) Take by mouth.      . FIBER SELECT GUMMIES PO Take by mouth.      . fluticasone (FLONASE) 50 MCG/ACT nasal spray INHALE 1 SPRAY IN EACH NOSTRIL ONCE DAILY  48 g  2  . losartan-hydrochlorothiazide (HYZAAR) 100-25 MG per tablet TAKE 1 TABLET BY MOUTH EVERY DAY  90 tablet  3  . LOVAZA 1 G capsule TAKE 4 CAPSULES BY MOUTH EVERY DAY   360 capsule  0  . Probiotic Product (PROBIOTIC DAILY PO) Take 1 tablet by mouth daily.       No current facility-administered medications for this visit.     Allergies (verified) Morphine   PAST HISTORY  Family History No family history on file.  Social History History  Substance Use Topics  . Smoking status: Former Games developer  . Smokeless tobacco: Not on file     Comment: quit 30 years ago  . Alcohol Use: Not on file     Are there smokers in your home (other than you)? No  Risk Factors Current exercise habits: walking 3 days per week  Dietary issues discussed: none   Cardiac risk factors: dyslipidemia, hypertension and obesity (BMI >= 30 kg/m2).  Depression Screen (Note: if answer to either of the following is "Yes", a more complete depression screening is indicated)   Over the past two weeks, have you felt down, depressed or hopeless? No  Over the past two weeks, have you felt little interest or pleasure in doing things? No  Have you lost interest or pleasure in daily life? No  Do you often feel hopeless? No  Do you cry  easily over simple problems? No  Activities of Daily Living In your present state of health, do you have any difficulty performing the following activities?:  Driving? No Managing money?  No Feeding yourself? No Getting from bed to chair? No Climbing a flight of stairs? No Preparing food and eating?: No Bathing or showering? No Getting dressed: No Getting to the toilet? No Using the toilet:No Moving around from place to place: No In the past year have you fallen or had a near fall?:No   Are you sexually active?  No  Do you have more than one partner?  No  Hearing Difficulties: Yes Do you often ask people to speak up or repeat themselves? Yes Do you experience ringing or noises in your ears? No Do you have difficulty understanding soft or whispered voices? Yes   Do you feel that you have a problem with memory? No  Do you often misplace  items? No  Do you feel safe at home?  Yes  Cognitive Testing  Alert? Yes  Normal Appearance?Yes  Oriented to person? Yes  Place? Yes   Time? Yes  Recall of three objects?  Yes  Can perform simple calculations? Yes  Displays appropriate judgment?Yes  Can read the correct time from a watch face?Yes   Advanced Directives have been discussed with the patient? Yes  List the Names of Other Physician/Practitioners you currently use: 1.    Indicate any recent Medical Services you may have received from other than [facility] providers in the past year (date may be approximate).  Immunization History  Administered Date(s) Administered  . Influenza Split 12/15/2011  . Influenza Whole 10/22/2008  . Pneumococcal Polysaccharide 11/23/2008  . Zoster 01/13/2010    Screening Tests Health Maintenance  Topic Date Due  . Influenza Vaccine  09/13/2012  . Mammogram  09/11/2013  . Tetanus/tdap  01/14/2020  . Colonoscopy  02/14/2020  . Pneumococcal Polysaccharide Vaccine Age 33 And Over  Completed  . Zostavax  Completed    All answers were reviewed with the patient and necessary referrals were made:  METHENEY,CATHERINE, MD   08/05/2012   History reviewed: allergies, current medications, past family history, past medical history, past social history, past surgical history and problem list  Review of Systems A comprehensive review of systems was negative.    Objective:     Vision by Aldean Jewett in Dec, 2013. Will call for report   Body mass index is 26.95 kg/(m^2). BP 127/80  Pulse 76  Ht 5' 4.6" (1.641 m)  Wt 160 lb (72.576 kg)  BMI 26.95 kg/m2  BP 127/80  Pulse 76  Ht 5' 4.6" (1.641 m)  Wt 160 lb (72.576 kg)  BMI 26.95 kg/m2  General Appearance:    Alert, cooperative, no distress, appears stated age  Head:    Normocephalic, without obvious abnormality, atraumatic  Eyes:    PERRL, conjunctiva/corneas clear, EOM's intact, both eyes  Ears:    Normal TM's and external ear canals,  both ears  Nose:   Nares normal, septum midline, mucosa normal, no drainage    or sinus tenderness  Throat:   Lips, mucosa, and tongue normal; teeth and gums normal  Neck:   Supple, symmetrical, trachea midline, no adenopathy;    thyroid:  no enlargement/tenderness/nodules; no carotid   bruit or JVD  Back:     Symmetric, no curvature, ROM normal, no CVA tenderness  Lungs:     Clear to auscultation bilaterally, respirations unlabored  Chest Wall:    No  tenderness or deformity   Heart:    Regular rate and rhythm, S1 and S2 normal, no murmur, rub   or gallop  Breast Exam:    Not performed.   Abdomen:     Soft, non-tender, bowel sounds active all four quadrants,    no masses, no organomegaly  Genitalia:    Not performed  Rectal:    Not performed.  Extremities:   Extremities normal, atraumatic, no cyanosis or edema  Pulses:   2+ and symmetric all extremities  Skin:   Skin color, texture, turgor normal, no rashes or lesions  Lymph nodes:   Cervical, supraclavicular, and axillary nodes normal  Neurologic:   CNII-XII intact, normal strength, sensation and reflexes    throughout       Assessment:     Medicare and will wellness exam     Plan:     During the course of the visit the patient was educated and counseled about appropriate screening and preventive services including:    Screening mammography - due in August  Due for labs  HTN - well controlled. F/U in 6 months  EKG-screening EKG performed says it did not have one on file for her. With her history of hypertension and hyperlipidemia it is important to have one. She is currently asymptomatic. Rate of 77 beats per minute, normal sinus rhythm with inverted T wave in lead V2 and poor R wave progression but otherwise no significant findings.  Diet review for nutrition referral? Yes ____  Not Indicated _x_   Patient Instructions (the written plan) was given to the patient.  Medicare Attestation I have personally  reviewed: The patient's medical and social history Their use of alcohol, tobacco or illicit drugs Their current medications and supplements The patient's functional ability including ADLs,fall risks, home safety risks, cognitive, and hearing and visual impairment Diet and physical activities Evidence for depression or mood disorders  The patient's weight, height, BMI, and visual acuity have been recorded in the chart.  I have made referrals, counseling, and provided education to the patient based on review of the above and I have provided the patient with a written personalized care plan for preventive services.     METHENEY,CATHERINE, MD   08/05/2012

## 2012-08-10 ENCOUNTER — Other Ambulatory Visit: Payer: Self-pay | Admitting: Family Medicine

## 2012-08-10 ENCOUNTER — Encounter: Payer: Self-pay | Admitting: *Deleted

## 2012-08-10 ENCOUNTER — Other Ambulatory Visit: Payer: Self-pay | Admitting: *Deleted

## 2012-08-10 MED ORDER — OMEGA-3-ACID ETHYL ESTERS 1 G PO CAPS: ORAL_CAPSULE | ORAL | Status: DC

## 2012-08-10 MED ORDER — ATORVASTATIN CALCIUM 40 MG PO TABS: ORAL_TABLET | ORAL | Status: DC

## 2012-08-29 ENCOUNTER — Other Ambulatory Visit: Payer: Self-pay | Admitting: Family Medicine

## 2012-08-30 ENCOUNTER — Other Ambulatory Visit: Payer: Self-pay | Admitting: Family Medicine

## 2012-10-01 ENCOUNTER — Other Ambulatory Visit: Payer: Self-pay | Admitting: Family Medicine

## 2012-10-29 ENCOUNTER — Other Ambulatory Visit: Payer: Self-pay | Admitting: Family Medicine

## 2012-11-12 ENCOUNTER — Other Ambulatory Visit: Payer: Self-pay | Admitting: Family Medicine

## 2012-11-18 DIAGNOSIS — Z1231 Encounter for screening mammogram for malignant neoplasm of breast: Secondary | ICD-10-CM | POA: Diagnosis not present

## 2012-11-24 ENCOUNTER — Telehealth: Payer: Self-pay | Admitting: Family Medicine

## 2012-11-24 MED ORDER — ATORVASTATIN CALCIUM 40 MG PO TABS: 40.0000 mg | ORAL_TABLET | Freq: Every day | ORAL | Status: DC

## 2012-11-24 NOTE — Telephone Encounter (Signed)
Pt informed.  Misty Ahmad, LPN  

## 2012-11-24 NOTE — Telephone Encounter (Signed)
Call patient: We did get notification from her insurance company that they will only cover one tablet per day of her Lipitor 40 mg instead of one and a half which is what she was previously taking. I just want to make her aware of this and we will adjust her current prescription.

## 2012-11-29 ENCOUNTER — Other Ambulatory Visit: Payer: Self-pay | Admitting: Family Medicine

## 2012-12-06 DIAGNOSIS — Z23 Encounter for immunization: Secondary | ICD-10-CM | POA: Diagnosis not present

## 2012-12-08 ENCOUNTER — Encounter: Payer: Self-pay | Admitting: Family Medicine

## 2012-12-14 ENCOUNTER — Telehealth: Payer: Self-pay | Admitting: Family Medicine

## 2012-12-14 MED ORDER — LOSARTAN POTASSIUM-HCTZ 100-25 MG PO TABS: ORAL_TABLET | ORAL | Status: DC

## 2012-12-14 MED ORDER — ATORVASTATIN CALCIUM 40 MG PO TABS: 40.0000 mg | ORAL_TABLET | Freq: Every day | ORAL | Status: DC

## 2012-12-14 NOTE — Telephone Encounter (Signed)
Gotcha.  She is right, not due for f/u until Jan. Sent over refills.

## 2012-12-14 NOTE — Telephone Encounter (Signed)
Patient called and stated that her pharmacy advised her that she will not be allowed to get a refill on med because she needs an appointment but Pt stated she already has an appointment scheduled and she may need a refill on her med by 01/25/13 and does not want to be denied because they think she does not have an appointment. Thanks

## 2013-02-07 ENCOUNTER — Encounter: Payer: Self-pay | Admitting: Family Medicine

## 2013-02-07 ENCOUNTER — Ambulatory Visit (INDEPENDENT_AMBULATORY_CARE_PROVIDER_SITE_OTHER): Payer: Medicare Other | Admitting: Family Medicine

## 2013-02-07 VITALS — BP 124/72 | HR 75 | Temp 97.4°F | Ht 65.0 in | Wt 161.0 lb

## 2013-02-07 DIAGNOSIS — E042 Nontoxic multinodular goiter: Secondary | ICD-10-CM | POA: Diagnosis not present

## 2013-02-07 DIAGNOSIS — I10 Essential (primary) hypertension: Secondary | ICD-10-CM

## 2013-02-07 DIAGNOSIS — E785 Hyperlipidemia, unspecified: Secondary | ICD-10-CM | POA: Diagnosis not present

## 2013-02-07 DIAGNOSIS — E041 Nontoxic single thyroid nodule: Secondary | ICD-10-CM

## 2013-02-07 NOTE — Progress Notes (Signed)
   Subjective:    Patient ID: Kendra [name], female    DOB: [birth year], 73 y.o.   MRN: [id number]  HPI Hypertension- Pt denies chest pain, SOB, dizziness, or heart palpitations.  Taking meds as directed w/o problems.  Denies medication side effects.  Due for lipids Lab Results  Component Value Date   CHOL 167 12/19/2011   HDL 70 12/19/2011   LDLCALC 85 12/19/2011   LDLDIRECT 111* 11/24/2008   TRIG 62 12/19/2011   CHOLHDL 2.8 08/12/2011    MNG - due to recheck levels.  No skin or hair or weight changes.  No changes in her thyroid gland. No palpable nodules.   Hyperlipidemia - Tolerating statin well with no S.E due for labs today.   Review of Systems     Objective:   Physical Exam  Constitutional: She is oriented to person, place, and time. She appears well-developed and well-nourished.  HENT:  Head: Normocephalic and atraumatic.  Neck: Neck supple. No thyromegaly present.  Borderline TMG.   Cardiovascular: Normal rate, regular rhythm and normal heart sounds.   Pulmonary/Chest: Effort normal and breath sounds normal.  Lymphadenopathy:    She has no cervical adenopathy.  Neurological: She is alert and oriented to person, place, and time.  Skin: Skin is warm and dry.  Psychiatric: She has a normal mood and affect. Her behavior is normal.          Assessment & Plan:  HTN - Well controlled. F/U in 6 months.    MNG- Exam is unchanged. Recheck in 6 months.    Hyperlipidemia - tolerating statin well.  Due to repeat lipids.

## 2013-02-08 ENCOUNTER — Other Ambulatory Visit: Payer: Self-pay | Admitting: *Deleted

## 2013-02-08 LAB — COMPREHENSIVE METABOLIC PANEL
ALT: 19 U/L (ref 0–35)
AST: 20 U/L (ref 0–37)
Albumin: 4.7 g/dL (ref 3.5–5.2)
Alkaline Phosphatase: 57 U/L (ref 39–117)
BUN: 20 mg/dL (ref 6–23)
CO2: 28 mEq/L (ref 19–32)
Calcium: 11.1 mg/dL — ABNORMAL HIGH (ref 8.4–10.5)
Chloride: 98 mEq/L (ref 96–112)
Creat: 0.92 mg/dL (ref 0.50–1.10)
Glucose, Bld: 96 mg/dL (ref 70–99)
Potassium: 3.9 mEq/L (ref 3.5–5.3)
Sodium: 136 mEq/L (ref 135–145)
Total Bilirubin: 0.7 mg/dL (ref 0.3–1.2)
Total Protein: 7.5 g/dL (ref 6.0–8.3)

## 2013-02-08 LAB — LIPID PANEL
Cholesterol: 207 mg/dL — ABNORMAL HIGH (ref 0–200)
HDL: 60 mg/dL (ref >39)
LDL Cholesterol: 121 mg/dL — ABNORMAL HIGH (ref 0–99)
Total CHOL/HDL Ratio: 3.5 Ratio
Triglycerides: 130 mg/dL (ref <150)
VLDL: 26 mg/dL (ref 0–40)

## 2013-02-08 LAB — TSH: TSH: 2.509 u[IU]/mL (ref 0.350–4.500)

## 2013-02-28 LAB — PTH, INTACT AND CALCIUM
Calcium: 9 mg/dL (ref 8.4–10.5)
PTH: 101.8 pg/mL — ABNORMAL HIGH (ref 14.0–72.0)

## 2013-03-02 ENCOUNTER — Other Ambulatory Visit: Payer: Self-pay | Admitting: Family Medicine

## 2013-03-03 DIAGNOSIS — H259 Unspecified age-related cataract: Secondary | ICD-10-CM | POA: Diagnosis not present

## 2013-03-03 DIAGNOSIS — H524 Presbyopia: Secondary | ICD-10-CM | POA: Diagnosis not present

## 2013-03-15 ENCOUNTER — Other Ambulatory Visit: Payer: Self-pay | Admitting: Family Medicine

## 2013-03-15 DIAGNOSIS — E349 Endocrine disorder, unspecified: Secondary | ICD-10-CM

## 2013-03-31 DIAGNOSIS — E210 Primary hyperparathyroidism: Secondary | ICD-10-CM | POA: Diagnosis not present

## 2013-03-31 LAB — BASIC METABOLIC PANEL
BUN: 22 mg/dL — AB (ref 4–21)
Creatinine: 0.9 mg/dL (ref 0.5–1.1)
Glucose: 128 mg/dL
Potassium: 3.9 mmol/L (ref 3.4–5.3)
Sodium: 141 mmol/L (ref 137–147)

## 2013-03-31 LAB — HEPATIC FUNCTION PANEL
ALT: 15 U/L (ref 7–35)
AST: 16 U/L (ref 13–35)
Alkaline Phosphatase: 66 U/L (ref 25–125)
Bilirubin, Total: 0.5 mg/dL

## 2013-03-31 LAB — VITAMIN D 1,25 DIHYDROXY: Vitamin D 1, 25 (OH)2 Total: 29.9

## 2013-04-05 ENCOUNTER — Encounter: Payer: Self-pay | Admitting: *Deleted

## 2013-04-05 ENCOUNTER — Other Ambulatory Visit: Payer: Self-pay | Admitting: Family Medicine

## 2013-05-01 ENCOUNTER — Encounter: Payer: Self-pay | Admitting: Family Medicine

## 2013-05-02 ENCOUNTER — Other Ambulatory Visit: Payer: Self-pay | Admitting: *Deleted

## 2013-05-02 MED ORDER — ATORVASTATIN CALCIUM 40 MG PO TABS: ORAL_TABLET | ORAL | Status: DC

## 2013-05-02 MED ORDER — OMEGA-3-ACID ETHYL ESTERS 1 G PO CAPS: ORAL_CAPSULE | ORAL | Status: DC

## 2013-05-02 MED ORDER — LOSARTAN POTASSIUM-HCTZ 100-25 MG PO TABS: ORAL_TABLET | ORAL | Status: DC

## 2013-06-25 ENCOUNTER — Emergency Department (INDEPENDENT_AMBULATORY_CARE_PROVIDER_SITE_OTHER)
Admission: EM | Admit: 2013-06-25 | Discharge: 2013-06-25 | Disposition: A | Discharge status: Home / Self Care | Payer: Medicare Other | Source: Home / Self Care

## 2013-06-25 ENCOUNTER — Encounter: Payer: Self-pay | Admitting: Emergency Medicine

## 2013-06-25 DIAGNOSIS — T148 Other injury of unspecified body region: Secondary | ICD-10-CM | POA: Diagnosis not present

## 2013-06-25 DIAGNOSIS — W57XXXA Bitten or stung by nonvenomous insect and other nonvenomous arthropods, initial encounter: Secondary | ICD-10-CM

## 2013-06-25 HISTORY — DX: Essential (primary) hypertension: I10

## 2013-06-25 HISTORY — DX: Pure hypercholesterolemia, unspecified: E78.00

## 2013-06-25 NOTE — Discharge Instructions (Signed)
May apply 1% hydrocortisone cream as needed for itching.   Lyme Disease You may have been bitten by a tick and are to watch for the development of Lyme Disease. Lyme Disease is an infection that is caused by a bacteria The bacteria causing this disease is named Borreilia burgdorferi. If a tick is infected with this bacteria and then bites you, then Lyme Disease may occur. These ticks are carried by deer and rodents such as rabbits and mice and infest grassy as well as forested areas. Fortunately most tick bites do not cause Lyme Disease.  Lyme Disease is easier to prevent than to treat. First, covering your legs with clothing when walking in areas where ticks are possibly abundant will prevent their attachment because ticks tend to stay within inches of the ground. Second, using insecticides containing DEET can be applied on skin or clothing. Last, because it takes about 12 to 24 hours for the tick to transmit the disease after attachment to the human host, you should inspect your body for ticks twice a day when you are in areas where Lyme Disease is common. You must look thoroughly when searching for ticks. The Ixodes tick that carries Lyme Disease is very small. It is around the size of a sesame seed (picture of tick is not actual size). Removal is best done by grasping the tick by the head and pulling it out. Do not to squeeze the body of the tick. This could inject the infecting bacteria into the bite site. Wash the area of the bite with an antiseptic solution after removal.  Lyme Disease is a disease that may affect many body systems. Because of the small size of the biting tick, most people do not notice being bitten. The first sign of an infection is usually a round red rash that extends out from the center of the tick bite. The center of the lesion may be blood colored (hemorrhagic) or have tiny blisters (vesicular). Most lesions have bright red outer borders and partial central clearing. This rash may  extend out many inches in diameter, and multiple lesions may be present. Other symptoms such as fatigue, headaches, chills and fever, general achiness and swelling of lymph glands may also occur. If this first stage of the disease is left untreated, these symptoms may gradually resolve by themselves, or progressive symptoms may occur because of spread of infection to other areas of the body.  Follow up with your caregiver to have testing and treatment if you have a tick bite and you develop any of the above complaints. Your caregiver may recommend preventative (prophylactic) medications which kill bacteria (antibiotics). Once a diagnosis of Lyme Disease is made, antibiotic treatment is highly likely to cure the disease. Effective treatment of late stage Lyme Disease may require longer courses of antibiotic therapy.  MAKE SURE YOU:   Understand these instructions.  Will watch your condition.  Will get help right away if you are not doing well or get worse. Document Released: 04/07/2000 Document Revised: 03/24/2011 Document Reviewed: 06/09/2008 [facility] Patient Information 2014 [place], [place].

## 2013-06-25 NOTE — ED Notes (Signed)
Pt was in [place] 1 week ago and found a tick on her R knee.  She removed the tick and still has a red spt at the site.  Pt denies any symptoms no fever, headache, fatigue.

## 2013-06-25 NOTE — ED Provider Notes (Signed)
CSN: [id number]     Arrival date & time 06/25/13  1007 History   None    Chief Complaint  Patient presents with  . Insect Bite    Tick R knee      HPI Comments: Patient was in [place] 1 week ago and found a small deer tick embedded on her R knee.  She removed the tick and still has a red spot at the site, but no tenderness or swelling.  She denies  fever, headache, fatigue, rash, arthralgias, and feels well otherwise.  Her last Tdap was in 2012.   The history is provided by the patient.    Past Medical History  Diagnosis Date  . Hypertension   . High cholesterol    Past Surgical History  Procedure Laterality Date  . Knee surgery    . Abdominal hysterectomy     History reviewed. No pertinent family history. History  Substance Use Topics  . Smoking status: Former Research scientist (life sciences)  . Smokeless tobacco: Never Used     Comment: quit 30 years ago  . Alcohol Use: Yes   OB History   Grav Para Term Preterm Abortions TAB SAB Ect Mult Living                 Review of Systems  Constitutional: Negative for fever, chills, activity change, appetite change and fatigue.  HENT: Negative.   Eyes: Negative.   Respiratory: Negative.   Cardiovascular: Negative.   Gastrointestinal: Negative.   Genitourinary: Negative.   Musculoskeletal: Negative for arthralgias, back pain, joint swelling, myalgias and neck pain.  Skin: Negative for rash.  Neurological: Negative for dizziness, weakness and headaches.    Allergies  Morphine  Home Medications   Prior to Admission medications   Medication Sig Start Date End Date Taking? Authorizing Provider  aspirin 81 MG tablet Take 81 mg by mouth daily.    Historical Provider, MD  atorvastatin (LIPITOR) 40 MG tablet TAKE 1 TABLET BY MOUTH AT BEDTIME 05/02/13   Hali Marry, MD  CALCIUM PO Take by mouth.    Historical Provider, MD  Famotidine (PEPCID PO) Take by mouth.    Historical Provider, MD  FIBER SELECT GUMMIES PO Take by mouth.     Historical Provider, MD  fluticasone (FLONASE) 50 MCG/ACT nasal spray USE 1 SPRAY IN EACH NOSTRIL EVERY DAY 11/12/12   Hali Marry, MD  losartan-hydrochlorothiazide [facility]) 100-25 MG per tablet TAKE 1 TABLET BY MOUTH EVERY DAY 05/02/13   Hali Marry, MD  omega-3 acid ethyl esters (LOVAZA) 1 G capsule TAKE 4 CAPSULES BY MOUTH EVERY DAY 05/02/13   Hali Marry, MD  Probiotic Product (PROBIOTIC DAILY PO) Take 1 tablet by mouth daily.    Historical Provider, MD   BP 121/81  Pulse 83  Temp(Src) 98 F (36.7 C) (Oral)  Ht 5\' 5"  (1.651 m)  Wt 160 lb (72.576 kg)  BMI 26.63 kg/m2  SpO2 98% Physical Exam  Nursing note and vitals reviewed. Constitutional: She is oriented to person, place, and time. She appears well-developed and well-nourished. No distress.  HENT:  Head: Normocephalic.  Mouth/Throat: Oropharynx is clear and moist.  Eyes: Conjunctivae are normal. Pupils are equal, round, and reactive to light.  Neck: Neck supple.  Cardiovascular: Normal heart sounds.   Pulmonary/Chest: Breath sounds normal.  Abdominal: There is no tenderness.  Musculoskeletal: She exhibits no edema.  Lymphadenopathy:    She has no cervical adenopathy.  Neurological: She is alert and oriented to person, place,  and time.  Skin: Skin is warm and dry. No rash noted.     Above the right knee is a slightly erythematous macule about 1.5cm diameter without tenderness, swelling, warmth, or fluctuance.    ED Course  Procedures  none    Labs Reviewed  B. BURGDORFI ANTIBODIES  ROCKY MTN SPOTTED FVR AB, IGM-BLOOD  ROCKY MTN SPOTTED FVR AB, IGG-BLOOD      MDM   1. Tick bite; no evidence tick borne disease or cellulitis.  The erythema present at tick bite site above right knee appears to be a mild inflammatory response.   Check Lyme Disease and RMSF antibodies. May apply 1% hydrocortisone cream as needed for itching. Followup with PCP if symptoms develop   Kandra Nicolas,  MD 06/25/13 1800

## 2013-06-27 LAB — ROCKY MTN SPOTTED FVR AB, IGG-BLOOD: RMSF IgG: 0.79 IV

## 2013-06-27 LAB — B. BURGDORFI ANTIBODIES: B burgdorferi Ab IgG+IgM: 0.27 {ISR}

## 2013-06-27 LAB — ROCKY MTN SPOTTED FVR AB, IGM-BLOOD: ROCKY MTN SPOTTED FEVER, IGM: 0.13 IV

## 2013-06-28 ENCOUNTER — Telehealth: Payer: Self-pay | Admitting: Emergency Medicine

## 2013-08-02 ENCOUNTER — Other Ambulatory Visit: Payer: Self-pay | Admitting: Family Medicine

## 2013-08-08 ENCOUNTER — Other Ambulatory Visit: Payer: Self-pay | Admitting: *Deleted

## 2013-08-08 DIAGNOSIS — E7800 Pure hypercholesterolemia, unspecified: Secondary | ICD-10-CM | POA: Diagnosis not present

## 2013-08-08 LAB — COMPLETE METABOLIC PANEL WITHOUT GFR
ALT: 16 U/L (ref 0–35)
AST: 16 U/L (ref 0–37)
Albumin: 4.4 g/dL (ref 3.5–5.2)
Alkaline Phosphatase: 64 U/L (ref 39–117)
BUN: 20 mg/dL (ref 6–23)
CO2: 26 meq/L (ref 19–32)
Calcium: 10.3 mg/dL (ref 8.4–10.5)
Chloride: 100 meq/L (ref 96–112)
Creat: 0.92 mg/dL (ref 0.50–1.10)
GFR, Est African American: 73 mL/min
GFR, Est Non African American: 63 mL/min
Glucose, Bld: 86 mg/dL (ref 70–99)
Potassium: 4.1 meq/L (ref 3.5–5.3)
Sodium: 136 meq/L (ref 135–145)
Total Bilirubin: 0.8 mg/dL (ref 0.2–1.2)
Total Protein: 7.4 g/dL (ref 6.0–8.3)

## 2013-08-08 LAB — LIPID PANEL
Cholesterol: 190 mg/dL (ref 0–200)
HDL: 57 mg/dL (ref >39)
LDL Cholesterol: 91 mg/dL (ref 0–99)
Total CHOL/HDL Ratio: 3.3 Ratio
Triglycerides: 208 mg/dL — ABNORMAL HIGH (ref <150)
VLDL: 42 mg/dL — ABNORMAL HIGH (ref 0–40)

## 2013-09-08 ENCOUNTER — Ambulatory Visit (INDEPENDENT_AMBULATORY_CARE_PROVIDER_SITE_OTHER): Payer: Medicare Other | Admitting: Family Medicine

## 2013-09-08 ENCOUNTER — Encounter: Payer: Self-pay | Admitting: Family Medicine

## 2013-09-08 VITALS — BP 116/69 | HR 79 | Ht 65.0 in | Wt 161.0 lb

## 2013-09-08 DIAGNOSIS — Z974 Presence of external hearing-aid: Secondary | ICD-10-CM | POA: Insufficient documentation

## 2013-09-08 DIAGNOSIS — Z0000 Encounter for general adult medical examination without abnormal findings: Secondary | ICD-10-CM | POA: Diagnosis not present

## 2013-09-08 DIAGNOSIS — Z1231 Encounter for screening mammogram for malignant neoplasm of breast: Secondary | ICD-10-CM

## 2013-09-08 DIAGNOSIS — Z780 Asymptomatic menopausal state: Secondary | ICD-10-CM

## 2013-09-08 DIAGNOSIS — Z23 Encounter for immunization: Secondary | ICD-10-CM | POA: Diagnosis not present

## 2013-09-08 MED ORDER — ATORVASTATIN CALCIUM 40 MG PO TABS: ORAL_TABLET | ORAL | Status: DC

## 2013-09-08 MED ORDER — LOSARTAN POTASSIUM-HCTZ 100-25 MG PO TABS: ORAL_TABLET | ORAL | Status: DC

## 2013-09-08 NOTE — Progress Notes (Addendum)
Subjective:    Kendra [name] is a 73 y.o. female who presents for Medicare Annual/Subsequent preventive examination.  Preventive Screening-Counseling & Management  Tobacco History  Smoking status  . Former Smoker  Smokeless tobacco  . Never Used    Comment: quit 30 years ago     Problems Prior to Visit 1.   Current Problems (verified) Patient Active Problem List   Diagnosis Date Noted  . Hearing aid worn 09/08/2013  . Wears hearing aid 08/05/2012  . Essential hypertension, benign 08/05/2012  . Multinodular goiter 08/12/2011  . THYROID NODULE 11/23/2008  . INCONTINENCE, URGE 11/23/2008  . POSTMENOPAUSAL STATUS 11/23/2008    Medications Prior to Visit Current Outpatient Prescriptions on File Prior to Visit  Medication Sig Dispense Refill  . aspirin 81 MG tablet Take 81 mg by mouth daily.      Marland Kitchen CALCIUM PO Take by mouth.      . Famotidine (PEPCID PO) Take by mouth.      . FIBER SELECT GUMMIES PO Take by mouth.      . fluticasone (FLONASE) 50 MCG/ACT nasal spray USE 1 SPRAY IN EACH NOSTRIL EVERY DAY  48 g  1  . omega-3 acid ethyl esters (LOVAZA) 1 G capsule TAKE 4 CAPSULES BY MOUTH EVERY DAY  360 capsule  0  . Probiotic Product (PROBIOTIC DAILY PO) Take 1 tablet by mouth daily.       No current facility-administered medications on file prior to visit.    Current Medications (verified) Current Outpatient Prescriptions  Medication Sig Dispense Refill  . aspirin 81 MG tablet Take 81 mg by mouth daily.      Marland Kitchen atorvastatin (LIPITOR) 40 MG tablet TAKE 1 TABLET BY MOUTH AT BEDTIME  90 tablet  1  . CALCIUM PO Take by mouth.      . Famotidine (PEPCID PO) Take by mouth.      . FIBER SELECT GUMMIES PO Take by mouth.      . fluticasone (FLONASE) 50 MCG/ACT nasal spray USE 1 SPRAY IN EACH NOSTRIL EVERY DAY  48 g  1  . losartan-hydrochlorothiazide (HYZAAR) 100-25 MG per tablet TAKE 1 TABLET BY MOUTH EVERY DAY  90 tablet  1  . omega-3 acid ethyl esters (LOVAZA) 1 G capsule TAKE 4  CAPSULES BY MOUTH EVERY DAY  360 capsule  0  . Probiotic Product (PROBIOTIC DAILY PO) Take 1 tablet by mouth daily.       No current facility-administered medications for this visit.     Allergies (verified) Morphine and Morphine and related   PAST HISTORY  Family History No family history on file.  Social History History  Substance Use Topics  . Smoking status: Former Research scientist (life sciences)  . Smokeless tobacco: Never Used     Comment: quit 30 years ago  . Alcohol Use: Yes     Are there smokers in your home (other than you)? No  Risk Factors Current exercise habits: walking some  Dietary issues discussed: None   Cardiac risk factors: advanced age (older than 55 for men, 45 for women).  Depression Screen (Note: if answer to either of the following is "Yes", a more complete depression screening is indicated)   Over the past two weeks, have you felt down, depressed or hopeless? No  Over the past two weeks, have you felt little interest or pleasure in doing things? No  Have you lost interest or pleasure in daily life? No  Do you often feel hopeless? No  Do you  cry easily over simple problems? No  Activities of Daily Living In your present state of health, do you have any difficulty performing the following activities?:  Driving? No Managing money?  No Feeding yourself? No Getting from bed to chair? No  Climbing a flight of stairs? No Preparing food and eating?: No Bathing or showering? No Getting dressed: No Getting to the toilet? No Using the toilet:No Moving around from place to place: No In the past year have you fallen or had a near fall?:No   Are you sexually active?  No  Do you have more than one partner?  No  Hearing Difficulties: Yes Do you often ask people to speak up or repeat themselves? Yes Do you experience ringing or noises in your ears? No Do you have difficulty understanding soft or whispered voices? Yes   Do you feel that you have a problem with memory?  No  Do you often misplace items? No  Do you feel safe at home?  Yes  Cognitive Testing  Alert? Yes  Normal Appearance?Yes  Oriented to person? Yes  Place? Yes   Time? Yes  Recall of three objects?  Yes  Can perform simple calculations? Yes  Displays appropriate judgment?Yes  Can read the correct time from a watch face?Yes    6CIT score of 0 today. - Normal    Advanced Directives have been discussed with the patient? Yes  List the Names of Other Physician/Practitioners you currently use: 1.  Dr. Darlis Loan 2. Dr. Steffanie Dunn (endocrine)  Indicate any recent Medical Services you may have received from other than [facility] providers in the past year (date may be approximate).  Immunization History  Administered Date(s) Administered  . Influenza Split 12/15/2011  . Influenza Whole 10/22/2008  . Influenza, High Dose Seasonal PF 12/14/2012  . Influenza,inj,Quad PF,36+ Mos 09/08/2013  . Pneumococcal Conjugate-13 09/08/2013  . Pneumococcal Polysaccharide-23 11/23/2008  . Zoster 01/13/2010    Screening Tests Health Maintenance  Topic Date Due  . Influenza Vaccine  08/13/2013  . Mammogram  11/19/2014  . Tetanus/tdap  01/14/2020  . Colonoscopy  02/14/2020  . Pneumococcal Polysaccharide Vaccine Age 28 And Over  Completed  . Zostavax  Completed    All answers were reviewed with the patient and necessary referrals were made:  Acie Custis, MD   09/08/2013   History reviewed: allergies, current medications, past family history, past medical history, past social history, past surgical history and problem list  Review of Systems A comprehensive review of systems was negative.    Objective:     Vision by Snellen chart: right eye: followed [facility], Dr. Domingo Cocking,   Wears hearing aids.    Body mass index is 26.79 kg/(m^2). BP 116/69  Pulse 79  Ht 5\' 5"  (1.651 m)  Wt 161 lb (73.029 kg)  BMI 26.79 kg/m2  BP 116/69  Pulse 79  Ht 5\' 5"  (1.651 m)  Wt 161 lb (73.029  kg)  BMI 26.79 kg/m2  General Appearance:    Alert, cooperative, no distress, appears stated age  Head:    Normocephalic, without obvious abnormality, atraumatic  Eyes:    PERRL, conjunctiva/corneas clear, EOM's intact both eyes  Ears:    Normal TM's and external ear canals, both ears  Nose:   Nares normal, septum midline, mucosa normal, no drainage    or sinus tenderness  Throat:   Lips, mucosa, and tongue normal; teeth and gums normal  Neck:   Supple, symmetrical, trachea midline, no adenopathy;  thyroid:  no enlargement/tenderness/nodules; no carotid   bruit or JVD  Back:     Symmetric, no curvature, ROM normal, no CVA tenderness  Lungs:     Clear to auscultation bilaterally, respirations unlabored  Chest Wall:    No tenderness or deformity   Heart:    Regular rate and rhythm, S1 and S2 normal, no murmur, rub   or gallop  Breast Exam:    No tenderness, masses, or nipple abnormality  Abdomen:     Soft, non-tender, bowel sounds active all four quadrants,    no masses, no organomegaly  Genitalia:    Not performed.   Rectal:    Not performed.   Extremities:   Extremities normal, atraumatic, no cyanosis or edema  Pulses:   2+ and symmetric all extremities  Skin:   Skin color, texture, turgor normal, no rashes or lesions  Lymph nodes:   Cervical, supraclavicular, and axillary nodes normal  Neurologic:   CNII-XII intact, normal strength, sensation and reflexes    throughout       Assessment:     Annual Medicare Wellness Exam       Plan:     During the course of the visit the patient was educated and counseled about appropriate screening and preventive services including:    Influenza vaccine  Prevnar 13  BMI 26 - we discussed walking more.    Due for mammo and bone density in November.  Orders placed today.  Will get her to sign a records release for [facility] [employer] in [place] for her shot record.  Diet review for nutrition referral? Yes ____  Not Indicated  _X_   Patient Instructions (the written plan) was given to the patient.  Medicare Attestation I have personally reviewed: The patient's medical and social history Their use of alcohol, tobacco or illicit drugs Their current medications and supplements The patient's functional ability including ADLs,fall risks, home safety risks, cognitive, and hearing and visual impairment Diet and physical activities Evidence for depression or mood disorders  The patient's weight, height, BMI, and visual acuity have been recorded in the chart.  I have made referrals, counseling, and provided education to the patient based on review of the above and I have provided the patient with a written personalized care plan for preventive services.     Curlee Bogan, MD   09/08/2013

## 2013-09-08 NOTE — Patient Instructions (Signed)
Keep up a regular exercise program and make sure you are eating a healthy diet Try to eat 4 servings of dairy a day, or if you are lactose intolerant take a calcium with vitamin D daily.  Your vaccines are up to date.   

## 2013-09-08 NOTE — Addendum Note (Signed)
Addended by: Beatris Ship L on: 09/08/2013 11:03 AM   Modules accepted: Orders

## 2013-09-09 ENCOUNTER — Encounter: Payer: Self-pay | Admitting: Family Medicine

## 2013-09-15 ENCOUNTER — Encounter: Payer: Self-pay | Admitting: Family Medicine

## 2013-09-15 DIAGNOSIS — R911 Solitary pulmonary nodule: Secondary | ICD-10-CM | POA: Insufficient documentation

## 2013-10-19 DIAGNOSIS — E210 Primary hyperparathyroidism: Secondary | ICD-10-CM | POA: Diagnosis not present

## 2013-10-19 DIAGNOSIS — E042 Nontoxic multinodular goiter: Secondary | ICD-10-CM | POA: Diagnosis not present

## 2013-10-26 ENCOUNTER — Other Ambulatory Visit: Payer: Self-pay | Admitting: Family Medicine

## 2014-03-13 ENCOUNTER — Ambulatory Visit: Payer: Medicare Other | Admitting: Family Medicine

## 2014-03-13 ENCOUNTER — Other Ambulatory Visit: Payer: Self-pay | Admitting: Family Medicine

## 2014-03-20 ENCOUNTER — Other Ambulatory Visit: Payer: Self-pay | Admitting: Family Medicine

## 2014-03-22 ENCOUNTER — Encounter: Payer: Self-pay | Admitting: Family Medicine

## 2014-03-22 ENCOUNTER — Other Ambulatory Visit: Payer: Self-pay | Admitting: *Deleted

## 2014-03-22 ENCOUNTER — Ambulatory Visit (INDEPENDENT_AMBULATORY_CARE_PROVIDER_SITE_OTHER): Payer: Medicare Other | Admitting: Family Medicine

## 2014-03-22 VITALS — BP 112/69 | HR 78 | Ht 65.0 in | Wt 165.0 lb

## 2014-03-22 DIAGNOSIS — I10 Essential (primary) hypertension: Secondary | ICD-10-CM | POA: Diagnosis not present

## 2014-03-22 MED ORDER — ATORVASTATIN CALCIUM 40 MG PO TABS: ORAL_TABLET | ORAL | Status: DC

## 2014-03-22 NOTE — Progress Notes (Signed)
   Subjective:    Patient ID: Kendra [name], female    DOB: Feb 26, 1940, 74 y.o.   MRN: [id number]  HPI Hypertension- Pt denies chest pain, SOB, dizziness, or heart palpitations.  Taking meds as directed w/o problems.  Denies medication side effects.    She denies falls in the last year.  She did not make it for her mammogram or bone density last summer. She says she know she needs to call to schedule this. Review of Systems     Objective:   Physical Exam  Constitutional: She is oriented to person, place, and time. She appears well-developed and well-nourished.  HENT:  Head: Normocephalic and atraumatic.  Cardiovascular: Normal rate, regular rhythm and normal heart sounds.   Pulmonary/Chest: Effort normal and breath sounds normal.  Neurological: She is alert and oriented to person, place, and time.  Skin: Skin is warm and dry.  Psychiatric: She has a normal mood and affect. Her behavior is normal.          Assessment & Plan:  HTN- well controlled on current regimen. Due for BMP. We will fax down to the lab and she said she will go next week when she comes to get her eye exam. Follow-up in 6 months.  Reminded her to schedule her mammogram and DEXA scan. She said she was originally scheduled for the bone density but had to cancel because of some things going on. She said she will call and try to get this scheduled again.

## 2014-04-10 DIAGNOSIS — I10 Essential (primary) hypertension: Secondary | ICD-10-CM | POA: Diagnosis not present

## 2014-04-11 LAB — BASIC METABOLIC PANEL
BUN: 19 mg/dL (ref 6–23)
CO2: 27 mEq/L (ref 19–32)
Calcium: 9.7 mg/dL (ref 8.4–10.5)
Chloride: 105 mEq/L (ref 96–112)
Creat: 0.91 mg/dL (ref 0.50–1.10)
Glucose, Bld: 91 mg/dL (ref 70–99)
Potassium: 4.1 mEq/L (ref 3.5–5.3)
Sodium: 139 mEq/L (ref 135–145)

## 2014-04-11 NOTE — Progress Notes (Signed)
Quick Note:  All labs are normal. ______ 

## 2014-05-04 ENCOUNTER — Other Ambulatory Visit: Payer: Self-pay | Admitting: Family Medicine

## 2014-05-05 NOTE — Telephone Encounter (Signed)
F/u around october

## 2014-05-31 ENCOUNTER — Ambulatory Visit (INDEPENDENT_AMBULATORY_CARE_PROVIDER_SITE_OTHER): Payer: Medicare Other

## 2014-05-31 DIAGNOSIS — Z1231 Encounter for screening mammogram for malignant neoplasm of breast: Secondary | ICD-10-CM | POA: Diagnosis not present

## 2014-05-31 DIAGNOSIS — Z13820 Encounter for screening for osteoporosis: Secondary | ICD-10-CM | POA: Diagnosis not present

## 2014-05-31 DIAGNOSIS — M8580 Other specified disorders of bone density and structure, unspecified site: Secondary | ICD-10-CM | POA: Diagnosis not present

## 2014-05-31 DIAGNOSIS — M859 Disorder of bone density and structure, unspecified: Secondary | ICD-10-CM

## 2014-05-31 DIAGNOSIS — Z780 Asymptomatic menopausal state: Secondary | ICD-10-CM

## 2014-06-14 ENCOUNTER — Other Ambulatory Visit: Payer: Self-pay | Admitting: Family Medicine

## 2014-06-16 DIAGNOSIS — H259 Unspecified age-related cataract: Secondary | ICD-10-CM | POA: Diagnosis not present

## 2014-07-19 IMAGING — CR DG ANKLE COMPLETE 3+V*R*
3 series · 3 of 3 positions shown · non-contrast
Comparison: None.

CLINICAL DATA: Pain over the lateral malleolus, no acute injury,
history of fracture in 5002

RIGHT ANKLE - COMPLETE 3+ VIEW

[view not recorded (1 of 3)]
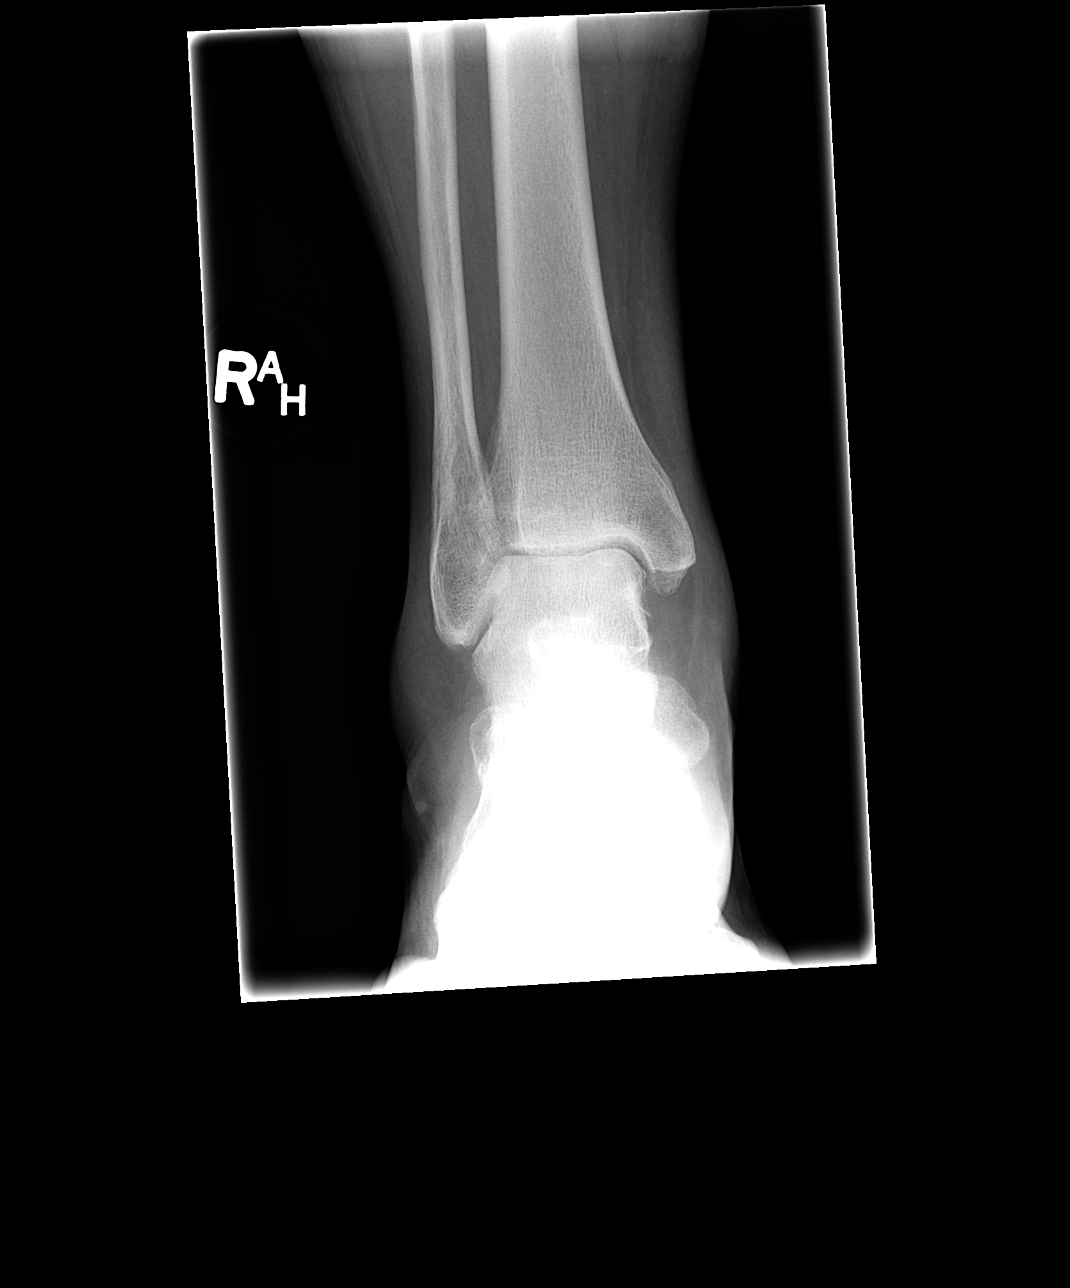

[view not recorded (2 of 3)]
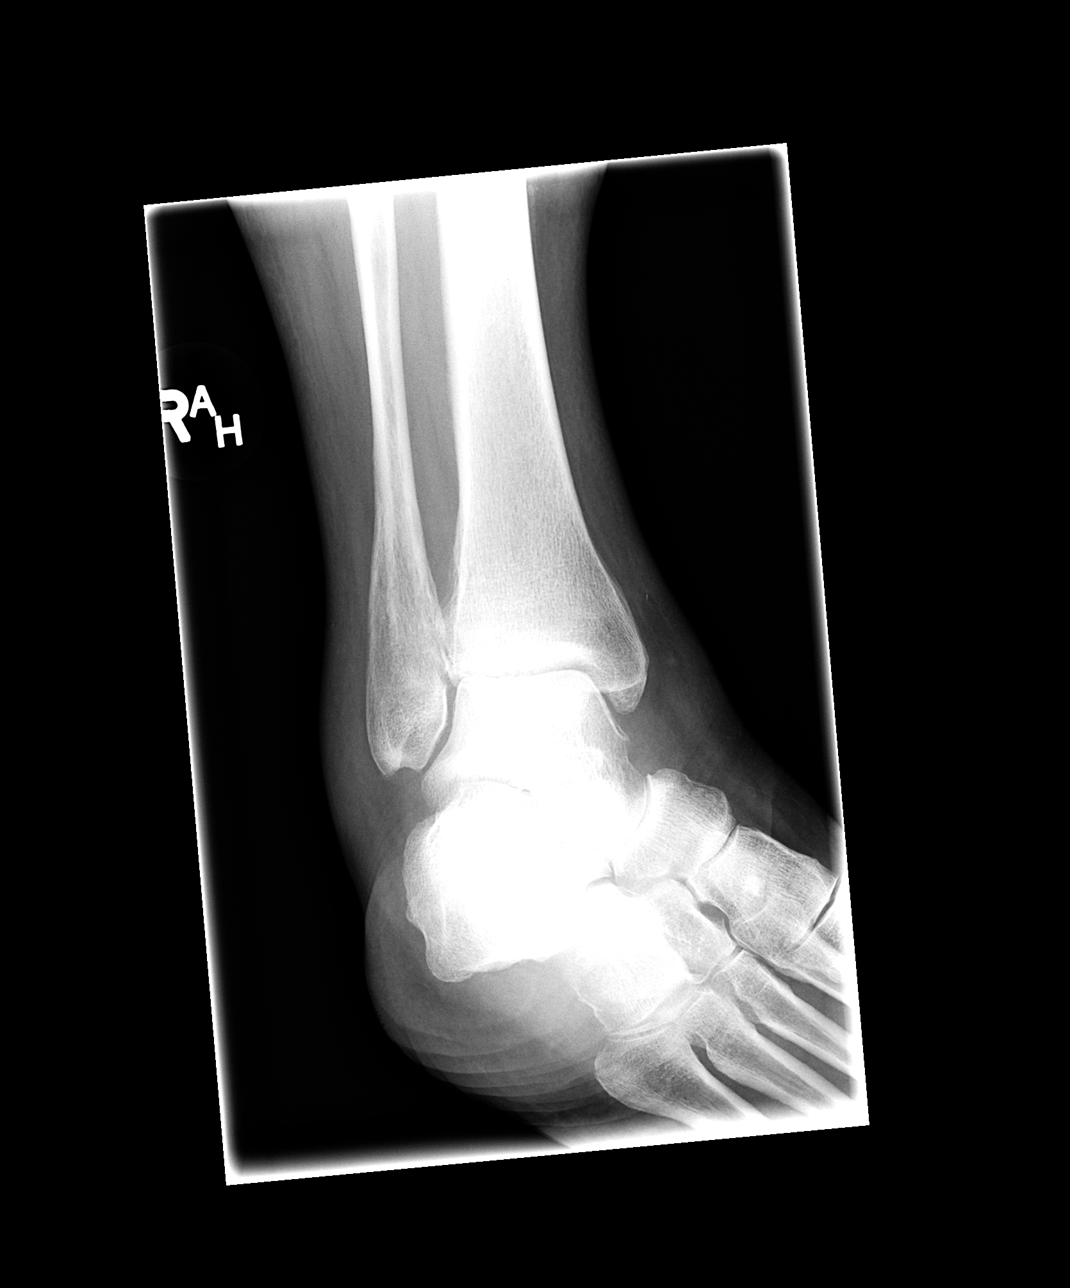

[view not recorded (3 of 3)]
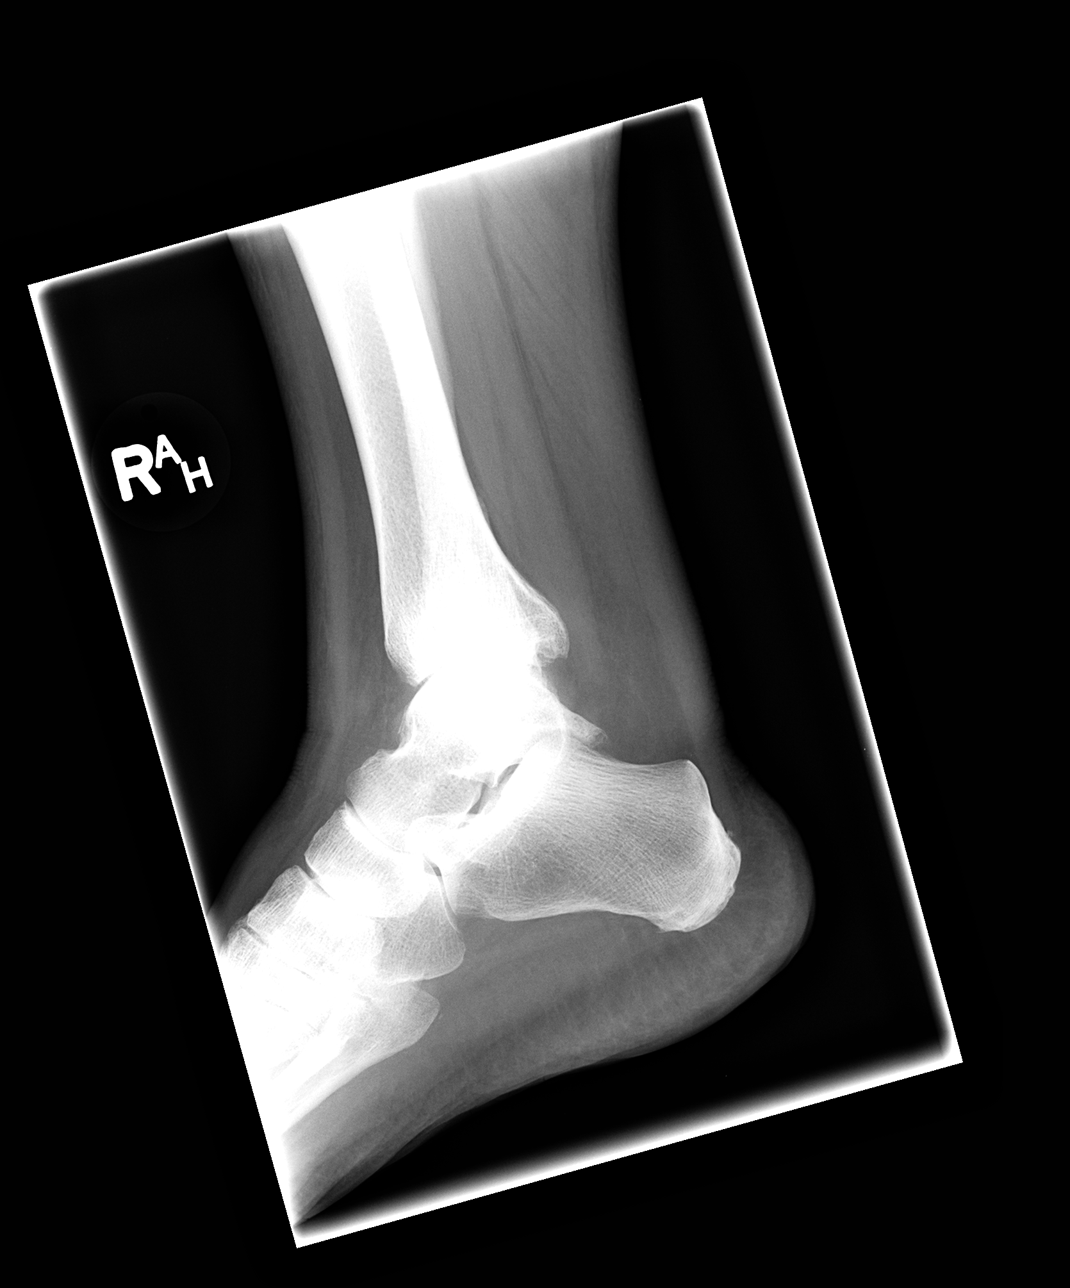

[3 of 3 positions shown; findings below may reference images not displayed]

FINDINGS: There is some deformity of the posterior malleolus which
may be related to the prior trauma.  No acute fracture is seen.
There is perhaps mild degenerative change of the right ankle joint
with some loss of joint space and sclerosis.  Alignment is normal.
IMPRESSION: Degenerative change possibly post-traumatic.  No acute abnormality.

## 2014-09-09 ENCOUNTER — Other Ambulatory Visit: Payer: Self-pay | Admitting: Family Medicine

## 2014-10-12 ENCOUNTER — Other Ambulatory Visit: Payer: Self-pay | Admitting: Family Medicine

## 2014-10-13 NOTE — Telephone Encounter (Signed)
No phone number on file to contact Pt to schedule appt.

## 2014-10-24 ENCOUNTER — Other Ambulatory Visit: Payer: Self-pay | Admitting: Family Medicine

## 2014-11-06 ENCOUNTER — Other Ambulatory Visit: Payer: Self-pay | Admitting: Family Medicine

## 2014-11-08 ENCOUNTER — Encounter: Payer: Medicare Other | Admitting: Family Medicine

## 2014-11-15 ENCOUNTER — Ambulatory Visit (INDEPENDENT_AMBULATORY_CARE_PROVIDER_SITE_OTHER): Payer: Medicare Other | Admitting: Family Medicine

## 2014-11-15 ENCOUNTER — Encounter: Payer: Self-pay | Admitting: Family Medicine

## 2014-11-15 VITALS — BP 111/59 | HR 80 | Temp 97.8°F | Ht 65.0 in | Wt 159.8 lb

## 2014-11-15 DIAGNOSIS — Z0000 Encounter for general adult medical examination without abnormal findings: Secondary | ICD-10-CM | POA: Diagnosis not present

## 2014-11-15 DIAGNOSIS — Z780 Asymptomatic menopausal state: Secondary | ICD-10-CM

## 2014-11-15 DIAGNOSIS — I10 Essential (primary) hypertension: Secondary | ICD-10-CM

## 2014-11-15 DIAGNOSIS — Z13220 Encounter for screening for lipoid disorders: Secondary | ICD-10-CM

## 2014-11-15 DIAGNOSIS — E559 Vitamin D deficiency, unspecified: Secondary | ICD-10-CM | POA: Diagnosis not present

## 2014-11-15 DIAGNOSIS — E041 Nontoxic single thyroid nodule: Secondary | ICD-10-CM | POA: Diagnosis not present

## 2014-11-15 DIAGNOSIS — Z23 Encounter for immunization: Secondary | ICD-10-CM | POA: Diagnosis not present

## 2014-11-15 LAB — COMPLETE METABOLIC PANEL WITH GFR
ALT: 16 U/L (ref 6–29)
AST: 20 U/L (ref 10–35)
Albumin: 4.2 g/dL (ref 3.6–5.1)
Alkaline Phosphatase: 60 U/L (ref 33–130)
BUN: 17 mg/dL (ref 7–25)
CO2: 26 mmol/L (ref 20–31)
Calcium: 10.1 mg/dL (ref 8.6–10.4)
Chloride: 100 mmol/L (ref 98–110)
Creat: 0.93 mg/dL (ref 0.60–0.93)
GFR, Est African American: 72 mL/min (ref >=60)
GFR, Est Non African American: 62 mL/min (ref >=60)
Glucose, Bld: 88 mg/dL (ref 65–99)
Potassium: 4.3 mmol/L (ref 3.5–5.3)
Sodium: 136 mmol/L (ref 135–146)
Total Bilirubin: 0.7 mg/dL (ref 0.2–1.2)
Total Protein: 6.9 g/dL (ref 6.1–8.1)

## 2014-11-15 LAB — TSH: TSH: 1.864 u[IU]/mL (ref 0.350–4.500)

## 2014-11-15 LAB — LIPID PANEL
Cholesterol: 183 mg/dL (ref 125–200)
HDL: 67 mg/dL (ref >=46)
LDL Cholesterol: 89 mg/dL (ref <130)
Total CHOL/HDL Ratio: 2.7 Ratio (ref <=5.0)
Triglycerides: 134 mg/dL (ref <150)
VLDL: 27 mg/dL (ref <30)

## 2014-11-15 MED ORDER — OMEGA-3-ACID ETHYL ESTERS 1 G PO CAPS: ORAL_CAPSULE | ORAL | Status: DC

## 2014-11-15 NOTE — Patient Instructions (Signed)
Keep up a regular exercise program and make sure you are eating a healthy diet Try to eat 4 servings of dairy a day, or if you are lactose intolerant take a calcium with vitamin D daily.  Your vaccines are up to date.   

## 2014-11-15 NOTE — Progress Notes (Signed)
Subjective:    Kendra [name] is a 74 y.o. female who presents for Medicare Annual/Subsequent preventive examination.  Preventive Screening-Counseling & Management  Tobacco History  Smoking status  . Former Smoker  Smokeless tobacco  . Never Used    Comment: quit 30 years ago     Problems Prior to Visit 1. None doing well.   Current Problems (verified) Patient Active Problem List   Diagnosis Date Noted  . Solitary pulmonary nodule 09/15/2013  . Hearing aid worn 09/08/2013  . Wears hearing aid 08/05/2012  . Essential hypertension, benign 08/05/2012  . Multinodular goiter 08/12/2011  . THYROID NODULE 11/23/2008  . INCONTINENCE, URGE 11/23/2008  . Asymptomatic postmenopausal status 11/23/2008    Medications Prior to Visit Current Outpatient Prescriptions on File Prior to Visit  Medication Sig Dispense Refill  . aspirin 81 MG tablet Take 81 mg by mouth daily.    Marland Kitchen atorvastatin (LIPITOR) 40 MG tablet Take 1 tablet (40 mg total) by mouth at bedtime. Due for a f/u appt 30 tablet 0  . CALCIUM PO Take by mouth.    . Famotidine (PEPCID PO) Take by mouth.    . FIBER SELECT GUMMIES PO Take by mouth.    . fluticasone (FLONASE) 50 MCG/ACT nasal spray USE 1 SPRAY IN EACH NOSTRIL EVERY DAY 48 g 1  . losartan-hydrochlorothiazide (HYZAAR) 100-25 MG tablet Take 1 tablet by mouth daily. Due for a f/u appt 30 tablet 0  . omega-3 acid ethyl esters (LOVAZA) 1 G capsule TAKE 4 CAPSULES BY MOUTH EVERY DAY. Needs appointment for future refills. 60 capsule 0  . Probiotic Product (PROBIOTIC DAILY PO) Take 1 tablet by mouth daily.     No current facility-administered medications on file prior to visit.    Current Medications (verified) Current Outpatient Prescriptions  Medication Sig Dispense Refill  . aspirin 81 MG tablet Take 81 mg by mouth daily.    Marland Kitchen atorvastatin (LIPITOR) 40 MG tablet Take 1 tablet (40 mg total) by mouth at bedtime. Due for a f/u appt 30 tablet 0  . CALCIUM PO Take by  mouth.    . Famotidine (PEPCID PO) Take by mouth.    . FIBER SELECT GUMMIES PO Take by mouth.    . fluticasone (FLONASE) 50 MCG/ACT nasal spray USE 1 SPRAY IN EACH NOSTRIL EVERY DAY 48 g 1  . losartan-hydrochlorothiazide (HYZAAR) 100-25 MG tablet Take 1 tablet by mouth daily. Due for a f/u appt 30 tablet 0  . omega-3 acid ethyl esters (LOVAZA) 1 G capsule TAKE 4 CAPSULES BY MOUTH EVERY DAY. Needs appointment for future refills. 60 capsule 0  . Probiotic Product (PROBIOTIC DAILY PO) Take 1 tablet by mouth daily.     No current facility-administered medications for this visit.     Allergies (verified) Morphine and Morphine and related   PAST HISTORY  Family History No family history on file.  Social History Social History  Substance Use Topics  . Smoking status: Former Research scientist (life sciences)  . Smokeless tobacco: Never Used     Comment: quit 30 years ago  . Alcohol Use: Yes     Are there smokers in your home (other than you)? No  Risk Factors Current exercise habits: walks 3 days per week  Dietary issues discussed: None   Cardiac risk factors: advanced age (older than 62 for men, 48 for women) and hypertension .  Depression Screen (Note: if answer to either of the following is "Yes", a more complete depression screening is indicated)  Over the past two weeks, have you felt down, depressed or hopeless? No  Over the past two weeks, have you felt little interest or pleasure in doing things? No  Have you lost interest or pleasure in daily life? No  Do you often feel hopeless? No  Do you cry easily over simple problems? No  Activities of Daily Living In your present state of health, do you have any difficulty performing the following activities?:  Driving? No Managing money?  No Feeding yourself? No Getting from bed to chair? No   Climbing a flight of stairs? No Preparing food and eating?: No Bathing or showering? No Getting dressed: No Getting to the toilet? No Using the  toilet:No Moving around from place to place: No In the past year have you fallen or had a near fall?:No   Are you sexually active?  No  Do you have more than one partner?  No  Hearing Difficulties: Yes Do you often ask people to speak up or repeat themselves? Yes Do you experience ringing or noises in your ears? No Do you have difficulty understanding soft or whispered voices? Yes   Do you feel that you have a problem with memory? No  Do you often misplace items? No  Do you feel safe at home?  Yes  Cognitive Testing  Alert? Yes  Normal Appearance?Yes  Oriented to person? Yes  Place? Yes   Time? Yes  Recall of three objects?  Yes  Can perform simple calculations? Yes  Displays appropriate judgment?Yes  Can read the correct time from a watch face?Yes   Advanced Directives have been discussed with the patient? Yes  List the Names of Other Physician/Practitioners you currently use: 1.    Indicate any recent Medical Services you may have received from other than [facility] providers in the past year (date may be approximate).  Immunization History  Administered Date(s) Administered  . Influenza Split 12/15/2011  . Influenza Whole 10/22/2008  . Influenza, High Dose Seasonal PF 12/14/2012  . Influenza,inj,Quad PF,36+ Mos 09/08/2013, 11/15/2014  . Pneumococcal Conjugate-13 09/08/2013  . Pneumococcal Polysaccharide-23 11/23/2008  . Zoster 01/13/2010    Screening Tests Health Maintenance  Topic Date Due  . INFLUENZA VACCINE  08/14/2015  . MAMMOGRAM  05/30/2016  . TETANUS/TDAP  01/14/2020  . COLONOSCOPY  02/14/2020  . DEXA SCAN  Completed  . ZOSTAVAX  Completed  . PNA vac Low Risk Adult  Completed    All answers were reviewed with the patient and necessary referrals were made:  METHENEY,CATHERINE, MD   11/15/2014   History reviewed: allergies, current medications, past family history, past medical history, past social history, past surgical history and problem list  Review  of Systems Pertinent items noted in HPI and remainder of comprehensive ROS otherwise negative.    Objective:     Vision by Snellen chart: Had eye exam in May.   Body mass index is 26.59 kg/(m^2). BP 111/59 mmHg  Pulse 80  Temp(Src) 97.8 F (36.6 C)  Ht 5\' 5"  (1.651 m)  Wt 159 lb 12.8 oz (72.485 kg)  BMI 26.59 kg/m2  BP 111/59 mmHg  Pulse 80  Temp(Src) 97.8 F (36.6 C)  Ht 5\' 5"  (1.651 m)  Wt 159 lb 12.8 oz (72.485 kg)  BMI 26.59 kg/m2 General appearance: alert, cooperative and appears stated age Head: Normocephalic, without obvious abnormality, atraumatic Eyes: conj, claer, EOMI, PEERLA Ears: normal TM's and external ear canals both ears Nose: Nares normal. Septum midline. Mucosa normal. No drainage  or sinus tenderness. Throat: lips, mucosa, and tongue normal; teeth and gums normal Neck: no adenopathy, no carotid bruit, no JVD, supple, symmetrical, trachea midline and thyroid not enlarged, symmetric, no tenderness/mass/nodules Back: symmetric, no curvature. ROM normal. No CVA tenderness. Lungs: clear to auscultation bilaterally Breasts: normal appearance, no masses or tenderness Heart: regular rate and rhythm, S1, S2 normal, no murmur, click, rub or gallop Abdomen: soft, non-tender; bowel sounds normal; no masses,  no organomegaly Extremities: extremities normal, atraumatic, no cyanosis or edema Pulses: 2+ and symmetric Skin: Skin color, texture, turgor normal. No rashes or lesions Lymph nodes: Cervical, supraclavicular, and axillary nodes normal. Neurologic: Alert and oriented X 3, normal strength and tone. Normal symmetric reflexes. Normal coordination and gait     Assessment:     Annual Medicare wellness exam       Plan:     During the course of the visit the patient was educated and counseled about appropriate screening and preventive services including:    Pneumococcal vaccine    Due for CMP and lipids  Vit d def - rehceck vit D levels.   Flu vaccine  given today.   Diet review for nutrition referral? Yes ____  Not Indicated _X__   Patient Instructions (the written plan) was given to the patient.  Medicare Attestation I have personally reviewed: The patient's medical and social history Their use of alcohol, tobacco or illicit drugs Their current medications and supplements The patient's functional ability including ADLs,fall risks, home safety risks, cognitive, and hearing and visual impairment Diet and physical activities Evidence for depression or mood disorders  The patient's weight, height, BMI, and visual acuity have been recorded in the chart.  I have made referrals, counseling, and provided education to the patient based on review of the above and I have provided the patient with a written personalized care plan for preventive services.     METHENEY,CATHERINE, MD   11/15/2014

## 2014-11-16 LAB — VITAMIN D 25 HYDROXY (VIT D DEFICIENCY, FRACTURES): Vit D, 25-Hydroxy: 42 ng/mL (ref 30–100)

## 2014-11-17 ENCOUNTER — Encounter: Payer: Self-pay | Admitting: Family Medicine

## 2014-11-20 ENCOUNTER — Other Ambulatory Visit: Payer: Self-pay | Admitting: Family Medicine

## 2014-12-14 ENCOUNTER — Other Ambulatory Visit: Payer: Self-pay | Admitting: Family Medicine

## 2015-03-20 ENCOUNTER — Ambulatory Visit (INDEPENDENT_AMBULATORY_CARE_PROVIDER_SITE_OTHER): Payer: Medicare Other | Admitting: Family Medicine

## 2015-03-20 ENCOUNTER — Encounter: Payer: Self-pay | Admitting: Family Medicine

## 2015-03-20 VITALS — BP 116/65 | HR 87 | Wt 159.0 lb

## 2015-03-20 DIAGNOSIS — E785 Hyperlipidemia, unspecified: Secondary | ICD-10-CM

## 2015-03-20 DIAGNOSIS — M25462 Effusion, left knee: Secondary | ICD-10-CM | POA: Diagnosis not present

## 2015-03-20 MED ORDER — ICOSAPENT ETHYL 1 G PO CAPS: 2.0000 | ORAL_CAPSULE | Freq: Two times a day (BID) | ORAL | Status: DC

## 2015-03-20 NOTE — Progress Notes (Signed)
   Subjective:    Patient ID: Kendra [name], female    DOB: 1940-04-17, 75 y.o.   MRN: [id number]  HPI Says about 1.5 weeks ago notices some swelling behind her left knee. No pain. Had a ligament repaired on that knee when she was 21.  She does get pain in that knee when going down stairs.   Hypertriglyceridemia - Her insurance will no longer cover Lovaza. Will need to change to Vascepa.    Review of Systems     Objective:   Physical Exam  Constitutional: She is oriented to person, place, and time. She appears well-developed and well-nourished.  HENT:  Head: Normocephalic and atraumatic.  Eyes: Conjunctivae and EOM are normal.  Cardiovascular: Normal rate.   Pulmonary/Chest: Effort normal.  Musculoskeletal:  Swelling behind the left knee. Most consistatn with Baker's cyst.   Neurological: She is alert and oriented to person, place, and time.  Skin: Skin is dry. No pallor.  Psychiatric: She has a normal mood and affect. Her behavior is normal.  Vitals reviewed.         Assessment & Plan:  Swelling behind the left knee - Most consistent with a Baker cyst but recommend ultrasound to confirm the diagnosis. If it is a Child psychotherapist cyst and is not really causing any symptoms and I recommend no treatment. Next  Hypertriglyceridemia-we'll switch her to Vascepa.

## 2015-03-20 NOTE — Patient Instructions (Signed)
Kendra [name] °A Kendra [name] is a sac-like structure that forms in the back of the knee. It is filled with the same fluid that is located in your knee. This fluid lubricates the bones and cartilage of the knee and allows them to move over each other more easily. °CAUSES  °When the knee becomes injured or inflamed, increased fluid forms in the knee. When this happens, the joint lining is pushed out behind the knee and forms the Kendra [name]. This [name] may also be caused by inflammation from arthritic conditions and infections. °SIGNS AND SYMPTOMS  °A Kendra [name] usually has no symptoms. When the [name] is substantially enlarged: °· You may feel pressure behind the knee, stiffness in the knee, or a mass in the area behind the knee. °· You may develop pain, redness, and swelling in the calf.  This can suggest a blood clot and requires evaluation by your health care provider. °DIAGNOSIS  °A Kendra [name] is most often found during an ultrasound exam. This exam may have been performed for other reasons, and the [name] was found incidentally. Sometimes an MRI is used. This picks up other problems within a joint that an ultrasound exam may not. If the Kendra [name] developed immediately after an injury, X-ray exams may be used to diagnose the [name]. °TREATMENT  °The treatment depends on the cause of the [name]. Anti-inflammatory medicines and rest often will be prescribed. If the [name] is caused by a bacterial infection, antibiotic medicines may be prescribed.  °HOME CARE INSTRUCTIONS  °· If the [name] was caused by an injury, for the first 24 hours, keep the injured leg elevated on 2 pillows while lying down. °· For the first 24 hours while you are awake, apply ice to the injured area: °¨ Put ice in a plastic bag. °¨ Place a towel between your skin and the bag. °¨ Leave the ice on for 20 minutes, 2-3 times a day. °· Only take over-the-counter or prescription medicines for pain, discomfort, or fever as directed by your health care  provider. °· Only take antibiotic medicine as directed. Make sure to finish it even if you start to feel better. °MAKE SURE YOU:  °· Understand these instructions. °· Will watch your condition. °· Will get help right away if you are not doing well or get worse. °  °This information is not intended to replace advice given to you by your health care provider. Make sure you discuss any questions you have with your health care provider. °  °Document Released: 12/30/2004 Document Revised: 10/20/2012 Document Reviewed: 08/11/2012 °Elsevier Interactive Patient Education ©2016 [employer] ° °

## 2015-03-26 ENCOUNTER — Ambulatory Visit (INDEPENDENT_AMBULATORY_CARE_PROVIDER_SITE_OTHER): Payer: Medicare Other

## 2015-03-26 DIAGNOSIS — M7122 Synovial cyst of popliteal space [Baker], left knee: Secondary | ICD-10-CM

## 2015-03-26 DIAGNOSIS — M25462 Effusion, left knee: Secondary | ICD-10-CM

## 2015-03-26 DIAGNOSIS — M7989 Other specified soft tissue disorders: Secondary | ICD-10-CM | POA: Diagnosis not present

## 2015-05-15 ENCOUNTER — Ambulatory Visit (INDEPENDENT_AMBULATORY_CARE_PROVIDER_SITE_OTHER): Payer: Medicare Other | Admitting: Family Medicine

## 2015-05-15 ENCOUNTER — Encounter: Payer: Self-pay | Admitting: Family Medicine

## 2015-05-15 VITALS — BP 115/70 | HR 78 | Wt 153.0 lb

## 2015-05-15 DIAGNOSIS — E785 Hyperlipidemia, unspecified: Secondary | ICD-10-CM | POA: Insufficient documentation

## 2015-05-15 DIAGNOSIS — I10 Essential (primary) hypertension: Secondary | ICD-10-CM | POA: Diagnosis not present

## 2015-05-15 DIAGNOSIS — Z86010 Personal history of colonic polyps: Secondary | ICD-10-CM | POA: Diagnosis not present

## 2015-05-15 DIAGNOSIS — Z860101 Personal history of adenomatous and serrated colon polyps: Secondary | ICD-10-CM | POA: Insufficient documentation

## 2015-05-15 NOTE — Progress Notes (Signed)
   Subjective:    Patient ID: Kendra [name], female    DOB: 1940-10-25, 75 y.o.   MRN: [id number]  HPI Hypertension- Pt denies chest pain, SOB, dizziness, or heart palpitations.  Taking meds as directed w/o problems.  Denies medication side effects.    Hyperlipidemia - Tolerating the atorvastatin well without any side effects or myalgias. Last lipids were 6 months ago. Next  She did want to let me know that her tetanus was done in 2012 at the round time she had a colonoscopy done. Next  History of colon polyps-she has gotten her recall for repeat colonoscopy since it has been 5 years and she plans on scheduling not seen.   Review of Systems     Objective:   Physical Exam  Constitutional: She is oriented to person, place, and time. She appears well-developed and well-nourished.  HENT:  Head: Normocephalic and atraumatic.  Cardiovascular: Normal rate, regular rhythm and normal heart sounds.   Pulmonary/Chest: Effort normal and breath sounds normal.  Neurological: She is alert and oriented to person, place, and time.  Skin: Skin is warm and dry.  Psychiatric: She has a normal mood and affect. Her behavior is normal.          Assessment & Plan:  HTN -Well controlled. Continue current regimen. Due for BMP. Follow up in 6 mon.   Hyperlipidemia-continue statin. We'll be due for repeat lipid in 6 months.  History of colon polyps-she is Artie been contacted to reschedule her colonoscopy and plans to do so seeing. Next  Tetanus updated in the immunization record.

## 2015-05-17 LAB — BASIC METABOLIC PANEL
BUN: 21 mg/dL (ref 7–25)
CO2: 26 mmol/L (ref 20–31)
Calcium: 11 mg/dL — ABNORMAL HIGH (ref 8.6–10.4)
Chloride: 101 mmol/L (ref 98–110)
Creat: 0.92 mg/dL (ref 0.60–0.93)
Glucose, Bld: 93 mg/dL (ref 65–99)
Potassium: 4.2 mmol/L (ref 3.5–5.3)
Sodium: 137 mmol/L (ref 135–146)

## 2015-05-17 LAB — HEMOGLOBIN A1C
Hgb A1c MFr Bld: 5.4 % (ref <5.7)
Mean Plasma Glucose: 108 mg/dL

## 2015-05-21 NOTE — Addendum Note (Signed)
Addended by: Teddy Spike on: 05/21/2015 10:47 AM   Modules accepted: Orders

## 2015-06-17 ENCOUNTER — Other Ambulatory Visit: Payer: Self-pay | Admitting: Family Medicine

## 2015-07-31 DIAGNOSIS — H259 Unspecified age-related cataract: Secondary | ICD-10-CM | POA: Diagnosis not present

## 2015-09-05 DIAGNOSIS — D123 Benign neoplasm of transverse colon: Secondary | ICD-10-CM | POA: Diagnosis not present

## 2015-09-05 DIAGNOSIS — K5730 Diverticulosis of large intestine without perforation or abscess without bleeding: Secondary | ICD-10-CM | POA: Diagnosis not present

## 2015-09-05 DIAGNOSIS — D120 Benign neoplasm of cecum: Secondary | ICD-10-CM | POA: Diagnosis not present

## 2015-09-05 DIAGNOSIS — Z86010 Personal history of colonic polyps: Secondary | ICD-10-CM | POA: Diagnosis not present

## 2015-09-05 LAB — HM COLONOSCOPY

## 2015-09-07 ENCOUNTER — Encounter: Payer: Self-pay | Admitting: Family Medicine

## 2015-09-11 ENCOUNTER — Encounter: Payer: Self-pay | Admitting: Family Medicine

## 2015-09-17 ENCOUNTER — Other Ambulatory Visit: Payer: Self-pay | Admitting: Family Medicine

## 2015-10-01 ENCOUNTER — Other Ambulatory Visit: Payer: Self-pay | Admitting: Family Medicine

## 2015-11-15 ENCOUNTER — Ambulatory Visit (INDEPENDENT_AMBULATORY_CARE_PROVIDER_SITE_OTHER): Payer: Medicare Other | Admitting: Family Medicine

## 2015-11-15 ENCOUNTER — Encounter: Payer: Self-pay | Admitting: Family Medicine

## 2015-11-15 VITALS — BP 110/63 | HR 72 | Wt 155.0 lb

## 2015-11-15 DIAGNOSIS — M659 Unspecified synovitis and tenosynovitis, unspecified site: Secondary | ICD-10-CM

## 2015-11-15 DIAGNOSIS — Z23 Encounter for immunization: Secondary | ICD-10-CM | POA: Diagnosis not present

## 2015-11-15 DIAGNOSIS — E785 Hyperlipidemia, unspecified: Secondary | ICD-10-CM

## 2015-11-15 DIAGNOSIS — I10 Essential (primary) hypertension: Secondary | ICD-10-CM | POA: Diagnosis not present

## 2015-11-15 NOTE — Progress Notes (Signed)
  Subjective:    CC: HTN  HPI:  Hypertension- Pt denies chest pain, SOB, dizziness, or heart palpitations.  Taking meds as directed w/o problems.  Denies medication side effects.    Hyperlipidemia-taking her statin without any side effects or problems. She's also taking her placebo which was the preferred agent on her insurance but is costing her $75 a month which is quite expensive.   She does have some swelling on the dorsum of the right hand. She says it's been on and off for the last several years. He says it never really affects the left hand. She says she was tested for rheumatoid arthritis probably 5 years or more ago.  Past medical history, Surgical history, Family history not pertinant except as noted below, Social history, Allergies, and medications have been entered into the medical record, reviewed, and corrections made.   Review of Systems: No fevers, chills, night sweats, weight loss, chest pain, or shortness of breath.   Objective:    General: Well Developed, well nourished, and in no acute distress.  Neuro: Alert and oriented x3, extra-ocular muscles intact, sensation grossly intact.  HEENT: Normocephalic, atraumatic  Skin: Warm and dry, no rashes. Cardiac: Regular rate and rhythm, no murmurs rubs or gallops, no lower extremity edema.  Respiratory: Clear to auscultation bilaterally. Not using accessory muscles, speaking in full sentences.   Impression and Recommendations:    Hyperlipidemia - due to recheck lipids and liver enzymes.  HTN - Well controlled. Continue current regimen. Follow up in  6 months.   Hypercalcemia - will check PTH.    Synovitis of the right hand - will check a sedimentation rate, CRP anti-CCP and rheumatoid factor. It's quite impressive on exam.

## 2015-11-16 LAB — SEDIMENTATION RATE: Sed Rate: 11 mm/hr (ref 0–30)

## 2015-11-16 LAB — COMPLETE METABOLIC PANEL WITH GFR
ALT: 15 U/L (ref 6–29)
AST: 18 U/L (ref 10–35)
Albumin: 4.1 g/dL (ref 3.6–5.1)
Alkaline Phosphatase: 59 U/L (ref 33–130)
BUN: 25 mg/dL (ref 7–25)
CO2: 23 mmol/L (ref 20–31)
Calcium: 10.7 mg/dL — ABNORMAL HIGH (ref 8.6–10.4)
Chloride: 101 mmol/L (ref 98–110)
Creat: 1.1 mg/dL — ABNORMAL HIGH (ref 0.60–0.93)
GFR, Est African American: 58 mL/min — ABNORMAL LOW (ref >=60)
GFR, Est Non African American: 50 mL/min — ABNORMAL LOW (ref >=60)
Glucose, Bld: 100 mg/dL — ABNORMAL HIGH (ref 65–99)
Potassium: 4.1 mmol/L (ref 3.5–5.3)
Sodium: 138 mmol/L (ref 135–146)
Total Bilirubin: 0.7 mg/dL (ref 0.2–1.2)
Total Protein: 7 g/dL (ref 6.1–8.1)

## 2015-11-16 LAB — LIPID PANEL
Cholesterol: 177 mg/dL (ref 125–200)
HDL: 56 mg/dL (ref >=46)
LDL Cholesterol: 101 mg/dL (ref <130)
Total CHOL/HDL Ratio: 3.2 Ratio (ref <=5.0)
Triglycerides: 98 mg/dL (ref <150)
VLDL: 20 mg/dL (ref <30)

## 2015-11-16 LAB — C-REACTIVE PROTEIN: CRP: 1.2 mg/L (ref <8.0)

## 2015-11-16 LAB — PTH, INTACT AND CALCIUM
Calcium: 10.7 mg/dL — ABNORMAL HIGH (ref 8.6–10.4)
PTH: 22 pg/mL (ref 14–64)

## 2015-11-16 LAB — CYCLIC CITRUL PEPTIDE ANTIBODY, IGG: Cyclic Citrullin Peptide Ab: <16 Units

## 2015-11-16 LAB — RHEUMATOID FACTOR: Rhuematoid fact SerPl-aCnc: <14 IU/mL (ref <14)

## 2015-12-20 ENCOUNTER — Other Ambulatory Visit: Payer: Self-pay

## 2016-03-12 ENCOUNTER — Other Ambulatory Visit: Payer: Self-pay | Admitting: Family Medicine

## 2016-03-17 ENCOUNTER — Other Ambulatory Visit: Payer: Self-pay | Admitting: Family Medicine

## 2016-03-17 DIAGNOSIS — Z1231 Encounter for screening mammogram for malignant neoplasm of breast: Secondary | ICD-10-CM

## 2016-04-09 ENCOUNTER — Ambulatory Visit (INDEPENDENT_AMBULATORY_CARE_PROVIDER_SITE_OTHER): Payer: Medicare Other

## 2016-04-09 DIAGNOSIS — Z1231 Encounter for screening mammogram for malignant neoplasm of breast: Secondary | ICD-10-CM

## 2016-04-17 ENCOUNTER — Other Ambulatory Visit: Payer: Self-pay | Admitting: Family Medicine

## 2016-05-14 ENCOUNTER — Ambulatory Visit (INDEPENDENT_AMBULATORY_CARE_PROVIDER_SITE_OTHER): Payer: Medicare Other | Admitting: Family Medicine

## 2016-05-14 ENCOUNTER — Encounter: Payer: Self-pay | Admitting: Family Medicine

## 2016-05-14 VITALS — BP 124/65 | HR 86 | Ht 65.0 in | Wt 162.0 lb

## 2016-05-14 DIAGNOSIS — F439 Reaction to severe stress, unspecified: Secondary | ICD-10-CM

## 2016-05-14 DIAGNOSIS — I10 Essential (primary) hypertension: Secondary | ICD-10-CM | POA: Diagnosis not present

## 2016-05-14 NOTE — Progress Notes (Signed)
Subjective:    CC: HTN   HPI:  Hypertension- Pt denies chest pain, SOB, dizziness, or heart palpitations.  Taking meds as directed w/o problems.  Denies medication side effects.    Husband recently dx with parkinsons. This is been fairly stressful her her. She admits she's not been exercising regularly for about the last 3 months.  Past medical history, Surgical history, Family history not pertinant except as noted below, Social history, Allergies, and medications have been entered into the medical record, reviewed, and corrections made.   Review of Systems: No fevers, chills, night sweats, weight loss, chest pain, or shortness of breath.   Objective:    General: Well Developed, well nourished, and in no acute distress.  Neuro: Alert and oriented x3, extra-ocular muscles intact, sensation grossly intact.  HEENT: Normocephalic, atraumatic  Skin: Warm and dry, no rashes. Cardiac: Regular rate and rhythm, no murmurs rubs or gallops, no lower extremity edema.  Respiratory: Clear to auscultation bilaterally. Not using accessory muscles, speaking in full sentences.   Impression and Recommendations:    HTN - Well controlled. Continue current regimen. Follow up in  6 months. Due for BMP today. Did encourage her to get back on track with daily exercise. Recheck hemoglobin A1c.  Stress at home-just encourage her to make sure that she is exercising and taking care of herself. Certainly were here if she needs anything or extra additional support.

## 2016-05-15 LAB — BASIC METABOLIC PANEL WITH GFR
BUN: 20 mg/dL (ref 7–25)
CO2: 25 mmol/L (ref 20–31)
Calcium: 9.6 mg/dL (ref 8.6–10.4)
Chloride: 104 mmol/L (ref 98–110)
Creat: 0.98 mg/dL — ABNORMAL HIGH (ref 0.60–0.93)
GFR, Est African American: 66 mL/min (ref >=60)
GFR, Est Non African American: 57 mL/min — ABNORMAL LOW (ref >=60)
Glucose, Bld: 91 mg/dL (ref 65–99)
Potassium: 4.1 mmol/L (ref 3.5–5.3)
Sodium: 140 mmol/L (ref 135–146)

## 2016-05-15 LAB — HEMOGLOBIN A1C
Hgb A1c MFr Bld: 5.1 % (ref <5.7)
Mean Plasma Glucose: 100 mg/dL

## 2016-05-16 ENCOUNTER — Other Ambulatory Visit: Payer: Self-pay | Admitting: Family Medicine

## 2016-05-19 ENCOUNTER — Ambulatory Visit (INDEPENDENT_AMBULATORY_CARE_PROVIDER_SITE_OTHER): Payer: Medicare Other | Admitting: Family Medicine

## 2016-05-19 ENCOUNTER — Ambulatory Visit (INDEPENDENT_AMBULATORY_CARE_PROVIDER_SITE_OTHER): Payer: Medicare Other

## 2016-05-19 DIAGNOSIS — M25511 Pain in right shoulder: Secondary | ICD-10-CM | POA: Diagnosis not present

## 2016-05-19 DIAGNOSIS — M19011 Primary osteoarthritis, right shoulder: Secondary | ICD-10-CM | POA: Diagnosis not present

## 2016-05-19 DIAGNOSIS — G8929 Other chronic pain: Secondary | ICD-10-CM | POA: Diagnosis not present

## 2016-05-19 DIAGNOSIS — M1712 Unilateral primary osteoarthritis, left knee: Secondary | ICD-10-CM | POA: Diagnosis not present

## 2016-05-19 MED ORDER — DICLOFENAC SODIUM 1 % TD GEL: 4.0000 g | Freq: Four times a day (QID) | TRANSDERMAL | 11 refills | Status: DC

## 2016-05-19 NOTE — Progress Notes (Signed)
   Subjective:    I'm seeing this patient as a consultation for:  Dr Beatrice Lecher  CC: Right shoulder pain  HPI: [name] is a 76 yo woman who present today with right shoulder pain and stiffness over the last 8-10 years.  It has been slowly worsening over time and presents as a dull achy pain on the anterior aspect of the shoulder.  It does not radiate.  Tylenol and heat help with the pain.  She had a Xray done about 8 years ago which showed evidence of DJD.     Past medical history, Surgical history, Family history not pertinant except as noted below, Social history, Allergies, and medications have been entered into the medical record, reviewed, and no changes needed.   Review of Systems: No headache, visual changes, nausea, vomiting, diarrhea, constipation, dizziness, abdominal pain, skin rash, fevers, chills, night sweats, weight loss, swollen lymph nodes, body aches, joint swelling, muscle aches, chest pain, shortness of breath, mood changes, visual or auditory hallucinations.   Objective:    Vitals:   05/19/16 1049  BP: 138/73  Pulse: 75  Temp: 98 F (36.7 C)   General: Well Developed, well nourished, and in no acute distress.  Neuro/Psych: Alert and oriented x3, extra-ocular muscles intact, able to move all 4 extremities, sensation grossly intact. Skin: Warm and dry, no rashes noted.  Respiratory: Not using accessory muscles, speaking in full sentences, trachea midline.  Cardiovascular: Pulses palpable, no extremity edema. Abdomen: Does not appear distended. MSK: Right shoulder: No obvious deformations, edema nor discoloration.  Limited ROM.  Abduction to about 80 degrees, unable to attempt internal rotation due to pain.  Weakness on afflicted side.  Full joint exam limited by pain.  Crepitations with ROM exam.   No results found for this or any previous visit (from the past 24 hour(s)). Dg Shoulder Right  Result Date: 05/19/2016 CLINICAL DATA:  Arthritis in right  shoulder for 10 years. Worsening pain EXAM: RIGHT SHOULDER - 2+ VIEW COMPARISON:  None. FINDINGS: Moderate advanced degenerative changes in the right AC and glenohumeral joints with joint space loss and spurring. No acute bony abnormality. Specifically, no fracture, subluxation, or dislocation. Soft tissues are intact. 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 No acute bony abnormality. Electronically Signed   By: Rolm Baptise M.D.   On: 05/19/2016 12:21        Impression and Recommendations:    Assessment and Plan: 76 y.o. 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 She also has knee pain and will try voltaren gel for symptom relief.  Consider injection at follow up visit.     Discussed warning signs or symptoms. Please see discharge instructions. Patient expresses understanding.  This patient was seen and interviewed and examined independently by myself. Kendra Leader, MD

## 2016-05-19 NOTE — Patient Instructions (Signed)
Thank you for coming in today. Apply the voltaren gel to the painful joint.  USe 2-4 grams every 6 hours as needed.   Return for recheck shoulder in about 4-6 weeks.    Arthritis Arthritis is a term that is commonly used to refer to joint pain or joint disease. There are more than 100 types of arthritis. What are the causes? The most common cause of this condition is wear and tear of a joint. Other causes include:  Gout.  Inflammation of a joint.  An infection of a joint.  Sprains and other injuries near the joint.  A drug reaction or allergic reaction. In some cases, the cause may not be known. What are the signs or symptoms? The main symptom of this condition is pain in the joint with movement. Other symptoms include:  Redness, swelling, or stiffness at a joint.  Warmth coming from the joint.  Fever.  Overall feeling of illness. How is this diagnosed? This condition may be diagnosed with a physical exam and tests, including:  Blood tests.  Urine tests.  Imaging tests, such as MRI, X-rays, or a CT scan. Sometimes, fluid is removed from a joint for testing. How is this treated? Treatment for this condition may involve:  Treatment of the cause, if it is known.  Rest.  Raising (elevating) the joint.  Applying cold or hot packs to the joint.  Medicines to improve symptoms and reduce inflammation.  Injections of a steroid such as cortisone into the joint to help reduce pain and inflammation. Depending on the cause of your arthritis, you may need to make lifestyle changes to reduce stress on your joint. These changes may include exercising more and losing weight. Follow these instructions at home: Medicines   Take over-the-counter and prescription medicines only as told by your health care provider.  Do not take aspirin to relieve pain if gout is suspected. Activity   Rest your joint if told by your health care provider. Rest is important when your disease  is active and your joint feels painful, swollen, or stiff.  Avoid activities that make the pain worse. It is important to balance activity with rest.  Exercise your joint regularly with range-of-motion exercises as told by your health care provider. Try doing low-impact exercise, such as:  Swimming.  Water aerobics.  Biking.  Walking. Joint Care    If your joint is swollen, keep it elevated if told by your health care provider.  If your joint feels stiff in the morning, try taking a warm shower.  If directed, apply heat to the joint. If you have diabetes, do not apply heat without permission from your health care provider.  Put a towel between the joint and the hot pack or heating pad.  Leave the heat on the area for 20-30 minutes.  If directed, apply ice to the joint:  Put ice in a plastic bag.  Place a towel between your skin and the bag.  Leave the ice on for 20 minutes, 2-3 times per day.  Keep all follow-up visits as told by your health care provider. This is important. Contact a health care provider if:  The pain gets worse.  You have a fever. Get help right away if:  You develop severe joint pain, swelling, or redness.  Many joints become painful and swollen.  You develop severe back pain.  You develop severe weakness in your leg.  You cannot control your bladder or bowels. This information is not intended to  replace advice given to you by your health care provider. Make sure you discuss any questions you have with your health care provider. Document Released: 02/07/2004 Document Revised: 06/07/2015 Document Reviewed: 03/27/2014 Elsevier Interactive Patient Education  2017 [employer].

## 2016-05-19 NOTE — Progress Notes (Signed)
Rt shoulder pain, dx with DJD 8-10 years ago

## 2016-06-18 ENCOUNTER — Other Ambulatory Visit: Payer: Self-pay | Admitting: Osteopathic Medicine

## 2016-06-30 ENCOUNTER — Ambulatory Visit: Payer: Medicare Other | Admitting: Family Medicine

## 2016-07-08 ENCOUNTER — Encounter: Payer: Self-pay | Admitting: Family Medicine

## 2016-07-08 ENCOUNTER — Ambulatory Visit (INDEPENDENT_AMBULATORY_CARE_PROVIDER_SITE_OTHER): Payer: Medicare Other | Admitting: Family Medicine

## 2016-07-08 VITALS — BP 130/74 | HR 77 | Ht 65.0 in | Wt 161.0 lb

## 2016-07-08 DIAGNOSIS — M19011 Primary osteoarthritis, right shoulder: Secondary | ICD-10-CM | POA: Diagnosis not present

## 2016-07-08 DIAGNOSIS — G8929 Other chronic pain: Secondary | ICD-10-CM | POA: Diagnosis not present

## 2016-07-08 DIAGNOSIS — M25511 Pain in right shoulder: Secondary | ICD-10-CM | POA: Diagnosis not present

## 2016-07-08 NOTE — Patient Instructions (Signed)
Thank you for coming in today. Recheck in 6 weeks or so.  Continue the voltaren gel or Blue Emu as needed.   We will continue to follow along.   Call or go to the ER if you develop a large red swollen joint with extreme pain or oozing puss.

## 2016-07-08 NOTE — Progress Notes (Signed)
Kendra [name] is a 76 y.o. female who presents to [facility] today for follow-up right shoulder pain. Patient was seen May 7 for chronic right shoulder pain. 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 She was treated conservatively with topical diclofenac gel which has not helped much. She notes chronic daily pain and limited mobility. The pain is interfering with her quality of life and ability to care for herself normally. She denies any radiating pain weakness or numbness.   Past Medical History:  Diagnosis Date  . High cholesterol   . Hypertension    Past Surgical History:  Procedure Laterality Date  . ABDOMINAL HYSTERECTOMY    . KNEE SURGERY     Social History  Substance Use Topics  . Smoking status: Former Research scientist (life sciences)  . Smokeless tobacco: Never Used     Comment: quit 30 years ago  . Alcohol use Yes     ROS:  As above   Medications: Current Outpatient Prescriptions  Medication Sig Dispense Refill  . aspirin 81 MG tablet Take 81 mg by mouth daily.    Marland Kitchen atorvastatin (LIPITOR) 40 MG tablet TAKE 1 TABLET(40 MG) BY MOUTH DAILY AT 6 PM 90 tablet 1  . diclofenac sodium (VOLTAREN) 1 % GEL Apply 4 g topically 4 (four) times daily. To affected joint. 100 g 11  . Famotidine (PEPCID PO) Take by mouth.    . FIBER SELECT GUMMIES PO Take by mouth.    . fluticasone (FLONASE) 50 MCG/ACT nasal spray SHAKE LIQUID AND USE 1 SPRAY IN EACH NOSTRIL EVERY DAY 48 g 2  . losartan-hydrochlorothiazide (HYZAAR) 100-25 MG tablet TAKE 1 TABLET BY MOUTH DAILY 90 tablet 1  . Probiotic Product (PROBIOTIC DAILY PO) Take 1 tablet by mouth daily.    Marland Kitchen VASCEPA 1 g CAPS TAKE 2 CAPSULES BY MOUTH TWICE DAILY 120 capsule 0   No current facility-administered medications for this visit.    Allergies  Allergen Reactions  . Morphine   . Morphine And Related Rash     Exam:  BP 130/74   Pulse 77   Ht 5\' 5"  (1.651 m)   Wt 161 lb (73  kg)   BMI 26.79 kg/m  General: Well Developed, well nourished, and in no acute distress.  Neuro/Psych: Alert and oriented x3, extra-ocular muscles intact, able to move all 4 extremities, sensation grossly intact. Skin: Warm and dry, no rashes noted.  Respiratory: Not using accessory muscles, speaking in full sentences, trachea midline.  Cardiovascular: Pulses palpable, no extremity edema. Abdomen: Does not appear distended. MSK: Right shoulder normal-appearing limited motion with crepitations. Pain with motion present.  Study Result   CLINICAL DATA:  Arthritis in right shoulder for 10 years. Worsening pain  EXAM: RIGHT SHOULDER - 2+ VIEW  COMPARISON:  None.  FINDINGS: Moderate advanced degenerative changes in the right AC and glenohumeral joints with joint space loss and spurring. No acute bony abnormality. Specifically, no fracture, subluxation, or dislocation. Soft tissues are intact.  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 No acute bony abnormality.   Electronically Signed   By: Rolm Baptise M.D.   On: 05/19/2016 12:21     Procedure: Real-time Ultrasound Guided Injection of right glenohumeral joint  Device: GE Logiq E  Images permanently stored and available for review in the ultrasound unit. Verbal informed consent obtained. Discussed risks and benefits of procedure. Warned about infection bleeding damage to  structures skin hypopigmentation and fat atrophy among others. Patient expresses understanding and agreement Time-out conducted.  Noted no overlying erythema, induration, or other signs of local infection.  Skin prepped in a sterile fashion.  Local anesthesia: Topical Ethyl chloride.  With sterile technique and under real time ultrasound guidance: 40 mg of Kenalog and 3 mL of Marcaine injected easily.  Completed without difficulty  Pain immediately resolved suggesting accurate placement of the  medication.  Advised to call if fevers/chills, erythema, induration, drainage, or persistent bleeding.  Images permanently stored and available for review in the ultrasound unit.  Impression: Technically successful ultrasound guided injection.      No results found for this or any previous visit (from the past 48 hour(s)). No results found.    Assessment and Plan: 76 y.o. female with right shoulder DJD. Patient had significant pain relief following injection of Marcaine. Hopefully the therapeutic triamcinolone will provide long-lasting pain relief. Recheck 6 weeks as needed.    No orders of the defined types were placed in this encounter.  No orders of the defined types were placed in this encounter.   Discussed warning signs or symptoms. Please see discharge instructions. Patient expresses understanding.

## 2016-07-21 ENCOUNTER — Other Ambulatory Visit: Payer: Self-pay | Admitting: Family Medicine

## 2016-07-30 DIAGNOSIS — H527 Unspecified disorder of refraction: Secondary | ICD-10-CM | POA: Diagnosis not present

## 2016-07-30 DIAGNOSIS — H25813 Combined forms of age-related cataract, bilateral: Secondary | ICD-10-CM | POA: Diagnosis not present

## 2016-08-19 ENCOUNTER — Ambulatory Visit: Payer: Medicare Other | Admitting: Family Medicine

## 2016-08-25 ENCOUNTER — Encounter: Payer: Self-pay | Admitting: Family Medicine

## 2016-08-25 ENCOUNTER — Ambulatory Visit (INDEPENDENT_AMBULATORY_CARE_PROVIDER_SITE_OTHER): Payer: Medicare Other | Admitting: Family Medicine

## 2016-08-25 VITALS — BP 107/64 | HR 55 | Wt 163.0 lb

## 2016-08-25 DIAGNOSIS — M25511 Pain in right shoulder: Secondary | ICD-10-CM

## 2016-08-25 DIAGNOSIS — G8929 Other chronic pain: Secondary | ICD-10-CM

## 2016-08-25 DIAGNOSIS — M19011 Primary osteoarthritis, right shoulder: Secondary | ICD-10-CM

## 2016-08-25 NOTE — Progress Notes (Signed)
Kendra [name] is a 76 y.o. female who presents to [facility] today for follow-up of right shoulder pain.  Patient reports that injection on June 26 relieved her shoulder pain for about 5 weeks. Since this time the pain has returned and her range of motion has become more limited. Patient notes that she can hear grinding and popping of the joint. Patient has tried Volteran gel, but has not found it helpful. Lidocaine gel and tylenol have been somewhat helpful in alleviating her pain. Patient reports that her pain is worst in the middle of the night and the pain wakes her up.   Patient denies any fevers, chills, weight loss, abdominal pain, chest pain, changes in bowel movements, or changes in urination.   Past Medical History:  Diagnosis Date  . High cholesterol   . Hypertension    Past Surgical History:  Procedure Laterality Date  . ABDOMINAL HYSTERECTOMY    . KNEE SURGERY     Social History  Substance Use Topics  . Smoking status: Former Research scientist (life sciences)  . Smokeless tobacco: Never Used     Comment: quit 30 years ago  . Alcohol use Yes     ROS:  As above   Medications: Current Outpatient Prescriptions  Medication Sig Dispense Refill  . aspirin 81 MG tablet Take 81 mg by mouth daily.    Marland Kitchen atorvastatin (LIPITOR) 40 MG tablet TAKE 1 TABLET(40 MG) BY MOUTH DAILY AT 6 PM 90 tablet 1  . diclofenac sodium (VOLTAREN) 1 % GEL Apply 4 g topically 4 (four) times daily. To affected joint. 100 g 11  . Famotidine (PEPCID PO) Take by mouth.    . FIBER SELECT GUMMIES PO Take by mouth.    . fluticasone (FLONASE) 50 MCG/ACT nasal spray SHAKE LIQUID AND USE 1 SPRAY IN EACH NOSTRIL EVERY DAY 48 g 2  . losartan-hydrochlorothiazide (HYZAAR) 100-25 MG tablet TAKE 1 TABLET BY MOUTH DAILY 90 tablet 1  . Probiotic Product (PROBIOTIC DAILY PO) Take 1 tablet by mouth daily.    Marland Kitchen VASCEPA 1 g CAPS TAKE 2 CAPSULES BY MOUTH TWICE DAILY 120 capsule 2   No current  facility-administered medications for this visit.    Allergies  Allergen Reactions  . Morphine   . Morphine And Related Rash     Exam:  BP 107/64   Pulse (!) 55   Wt 163 lb (73.9 kg)   BMI 27.12 kg/m  General: Well Developed, well nourished, and in no acute distress.  Neuro/Psych: Alert and oriented x3, extra-ocular muscles intact, able to move all 4 extremities, sensation grossly intact. Skin: Warm and dry, no rashes noted.  Respiratory: Not using accessory muscles, speaking in full sentences, trachea midline.  Cardiovascular: Pulses palpable, no extremity edema. Abdomen: Does not appear distended. MSK: Right shoulder: No gross deformity or swelling on inspection No tenderness to palpation of joint Range of motion is limited to 90 degrees abduction, flexion to 90 degrees, internal rotation limited to 10 degrees, crepitus and joint popping noted with movement Strength is 4/5 with abduction    No results found for this or any previous visit (from the past 48 hour(s)). No results found.    Assessment and Plan: 76 y.o. female presenting for follow-up of shoulder pain. Given patient's short-term relief with a steroid injection, it is likely that additional conservative options will not be beneficial at this point. Patient was referred to Orthopedic Surgery to be evaluated for shoulder replacement.   Orders  Placed This Encounter  Procedures  . Ambulatory referral to Orthopedic Surgery    Referral Priority:   Routine    Referral Type:   Surgical    Referral Reason:   Specialty Services Required    Referred to Provider:   Mcarthur Rossetti, MD    Requested Specialty:   Orthopedic Surgery    Number of Visits Requested:   1   No orders of the defined types were placed in this encounter.   Discussed warning signs or symptoms. Please see discharge instructions. Patient expresses understanding.

## 2016-08-25 NOTE — Patient Instructions (Signed)
Thank you for coming in today. You should hear from Dr Kendra [name] office soon.  Let me know if you do not hear from him.   You can do your PT/Rehab here in [place].    Shoulder Joint Replacement Shoulder joint replacement is surgery to replace damaged parts of the shoulder joint with artificial parts (prostheses). Two parts may be used to replace this joint:  The humeral component replaces the head of the upper arm bone (humerus). This is a rounded ball that is attached to a stem that fits into the humerus.  The glenoid component replaces the socket (glenoid depression).  The prostheses are usually made of metal and plastic. Depending on the damage to your shoulder, the surgeon may replace just the humeral head (hemiarthroplasty) or replace both the humeral head and the glenoid (total shoulder replacement). The surrounding muscles and tendons hold the prosthetic parts in place. This procedure may be done to relieve joint pain or to treat severe shoulder fractures or arthritis. This surgery may be done if other non-surgical treatments have not worked. Tell a health care provider about:  Any allergies you have.  All medicines you are taking, including vitamins, herbs, eye drops, creams, and over-the-counter medicines.  Any problems you or family members have had with anesthetic medicines.  Any blood disorders you have.  Any surgeries you have had.  Any medical conditions you have.  Whether you are pregnant or may be pregnant. What are the risks? Generally, this is a safe procedure. However, problems may occur, including:  Infection.  Bleeding.  Allergic reactions to medicines.  Damage to other structures, organs, or nerves.  Fracture of the upper arm bone during or after surgery.  Instability of the shoulder after surgery.  Loosening of the glenoid component over time.  Unusual bone growth.  Failure of bone healing after surgery.  What happens before the  procedure? Medicines  Ask your health care provider about: ? Changing or stopping your regular medicines. This is especially important if you are taking diabetes medicines or blood thinners. ? Taking medicines such as aspirin and ibuprofen. These medicines can thin your blood. Do not take these medicines before your procedure if your health care provider instructs you not to.  You may be given antibiotic medicine to help prevent infection. Staying hydrated Follow instructions from your health care provider about hydration, which may include:  Up to 2 hours before the procedure - you may continue to drink clear liquids, such as water, clear fruit juice, black coffee, and plain tea.  Eating and drinking restrictions Follow instructions from your health care provider about eating and drinking, which may include:  8 hours before the procedure - stop eating heavy meals or foods such as meat, fried foods, or fatty foods.  6 hours before the procedure - stop eating light meals or foods, such as toast or cereal.  6 hours before the procedure - stop drinking milk or drinks that contain milk.  2 hours before the procedure - stop drinking clear liquids.  General instructions  Plan to have someone take you home from the hospital or clinic.  Plan to have someone with you for 24 hours after the procedure. It is also recommended that you have someone to assist you at home for the first few weeks after the procedure.  Do not use any products that contain nicotine or tobacco, such as cigarettes and e-cigarettes. If you need help quitting, ask your healthcare provider.  Ask your health care provider  how your surgical site will be marked or identified. What happens during the procedure?  To reduce your risk of infection: ? Your health care team will wash or sanitize their hands. ? Your skin will be washed with soap. ? Hair may be removed from the surgical area.  An IV tube will be inserted into  one of your veins.  You will be given one or more of the following: ? A medicine to help you relax (sedative). ? A medicine to make you fall asleep (general anesthetic). ? A medicine that is injected into your shoulder area to numb everything around the injection site (regional anesthetic).  An incision will be made on the front of the shoulder from the collarbone (clavicle) to the point where the shoulder muscle (deltoid) attaches to the upper arm bone.  The upper arm bone will be removed from the socket to expose the ball-like end of the upper arm.  The center cavity of the humerus bone will be cleaned and enlarged to create a hollow area that matches the shape of the implant stem. The top end of the bone will be smoothed so the stem will be level with the bone surface when it is inserted.  If the ball of the prosthesis is a separate piece, the proper size will be selected and attached.  If the socket portion of the joint is healthy and the surrounding muscles are in good condition, the surgeon may decide not to replace it. However, if the socket needs to be replaced: ? The surgeon will prepare the socket surface by removing the remaining damaged cartilage. ? The socket bone will be gently reshaped to fit the implant. ? The glenoid component will be implanted and cemented into position.  The arm bone, with its new artificial head, will be replaced in the socket. The surgeon will reattach the supporting tendons and close the incision with sutures or stitches.  A bandage (dressing) will be placed over your incision.  Your arm will be placed in a sling or immobilizer, and a support pillow will be placed under your elbow.  Tubes will be placed to remove excess drainage. These are usually removed after a couple of days. The procedure may vary among health care providers and hospitals. What happens after the procedure?  Your blood pressure, heart rate, breathing rate, and blood oxygen level  will be monitored until the medicines you were given have worn off.  Your arm will be numb if you were given a regional anesthetic. This may last until the next day.  You will be given pain medicine as needed.  Your arm and shoulder will be stiff and bruised. This will improve over time.  An icing device will be placed around your shoulder. This helps to control pain and swelling.  Your arm will be in a sling or immobilizer. You will need to wear this for 2-4 weeks after surgery or as told by your health care provider.  Your health care team may begin to show you exercises for your shoulder.  Do not use your arm to push yourself up in bed or from a chair.  Do not lift anything that is heavier than a cup of coffee.  Do not drive for 24 hours if you were given a sedative. Ask your health care provider when it is safe for you to drive. Summary  Shoulder joint replacement is surgery to replace damaged parts of the shoulder joint with artificial parts (prostheses).  The surgeon [phone]  replace just the humeral head (hemiarthroplasty) or replace both the humeral head and the glenoid (total shoulder replacement), based on the damage to your shoulder.  Taking medicine, icing the painful area, and doing exercises as told by your health care provider will help control your shoulder pain, swelling, and stiffness after surgery.  After the procedure, do not lift anything that is heavier than a cup of coffee and do not use your arm to push yourself up in bed or from a chair. This information is not intended to replace advice given to you by your health care provider. Make sure you discuss any questions you have with your health care provider. Document Released: 09/28/2002 Document Revised: 10/15/2015 Document Reviewed: 10/15/2015 Elsevier Interactive Patient Education  2018 [employer].

## 2016-09-08 ENCOUNTER — Ambulatory Visit (INDEPENDENT_AMBULATORY_CARE_PROVIDER_SITE_OTHER): Payer: Medicare Other | Admitting: Orthopaedic Surgery

## 2016-09-08 ENCOUNTER — Encounter (INDEPENDENT_AMBULATORY_CARE_PROVIDER_SITE_OTHER): Payer: Self-pay | Admitting: Orthopaedic Surgery

## 2016-09-08 DIAGNOSIS — M19011 Primary osteoarthritis, right shoulder: Secondary | ICD-10-CM

## 2016-09-08 DIAGNOSIS — M25511 Pain in right shoulder: Secondary | ICD-10-CM

## 2016-09-08 DIAGNOSIS — G8929 Other chronic pain: Secondary | ICD-10-CM | POA: Insufficient documentation

## 2016-09-08 NOTE — Addendum Note (Signed)
Addended by: Meyer Cory on: 09/08/2016 02:39 PM   Modules accepted: Orders

## 2016-09-08 NOTE — Progress Notes (Signed)
Office Visit Note   Patient: Kendra [name]           Date of Birth: [birth year]           MRN: [id number] Visit Date: 09/08/2016              Requested by: Gregor Hams, MD [address] [place], [place] [id number] PCP: Hali Marry, MD   Assessment & Plan: Visit Diagnoses:  1. Chronic right shoulder pain   2. Primary osteoarthritis of right shoulder     Plan: I talked her about trying an intra-articular steroid injection under direct fluoroscopy and how this is performed. We will set this up to Dr. Ernestina Patches because she is definitely interested in trying this type of injection. I showed her shoulder model and went over x-rays and talk about the possibility of a shoulder replacement and the future. I have also talked to my partner Dr. Marlou Sa who is happy to see her in follow-up after the injection. All questions were encouraged and answered.  Follow-Up Instructions: Return in about 4 weeks (around 10/06/2016).   Orders:  No orders of the defined types were placed in this encounter.  No orders of the defined types were placed in this encounter.     Procedures: No procedures performed   Clinical Data: No additional findings.   Subjective: Chief Complaint  Patient presents with  . Right Shoulder - Pain  The patient is very pleasant right-hand-dominant 76 year old who comes in with chronic right shoulder pain. She actually has x-rays on canopy system and says she knows that she has severe arthritis in her right shoulder. She is someone who enjoys bowling for many years and feels that this contributed to her shoulder pain area she denies any neck pain. She denies any numbness and tingling in her right hand. She does report limited motion of her right shoulder and pain with certain activities. Her sternum and affect her activities daily living as well detrimentally. She is currently not a smoker. She has had 1 subacromial injection by another physician that did  help for about 6 weeks.  HPI  Review of Systems She currently denies any headache, chest pain, shortness of breath, fever, chills, nausea, vomiting.  Objective: Vital Signs: There were no vitals taken for this visit.  Physical Exam She is alert and oriented 3 and in no acute distress Ortho Exam Examination of her right shoulder shows limited abduction and neck surgery rotation. Her internal rotation with adduction is also limited. She has weak rotator cuff and uses her deltoids along with the cuff to abduct her shoulder. Her elbow hand and wrist exam are normal. I can feel grinding of the glenohumeral joint. There is also pain at the acromioclavicular joint. Specialty Comments:  No specialty comments available.  Imaging: No results found. 3 views of her right shoulder independently reviewed on the canopy system show significant glenohumeral arthritis.  PMFS History: Patient Active Problem List   Diagnosis Date Noted  . Chronic right shoulder pain 09/08/2016  . Primary osteoarthritis of right shoulder 07/08/2016  . Right shoulder pain 05/19/2016  . 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 History of colonic polyps 05/15/2015  . Hyperlipidemia 05/15/2015  . Solitary pulmonary nodule 09/15/2013  . Wears hearing aid 08/05/2012  . Essential hypertension, benign 08/05/2012  . Multinodular goiter 08/12/2011  . THYROID NODULE 11/23/2008  . INCONTINENCE, URGE 11/23/2008  . Asymptomatic postmenopausal status 11/23/2008   Past Medical History:  Diagnosis Date  .  High cholesterol   . Hypertension     No family history on file.  Past Surgical History:  Procedure Laterality Date  . ABDOMINAL HYSTERECTOMY    . KNEE SURGERY     Social History   Occupational History  . Not on file.   Social History Main Topics  . Smoking status: Former Research scientist (life sciences)  . Smokeless tobacco: Never Used     Comment: quit 30 years ago  . Alcohol use Yes  . Drug use: No  . Sexual activity: Not on file

## 2016-09-11 ENCOUNTER — Other Ambulatory Visit: Payer: Self-pay | Admitting: Family Medicine

## 2016-09-22 ENCOUNTER — Ambulatory Visit (INDEPENDENT_AMBULATORY_CARE_PROVIDER_SITE_OTHER): Payer: BLUE CROSS/BLUE SHIELD

## 2016-09-22 ENCOUNTER — Ambulatory Visit (INDEPENDENT_AMBULATORY_CARE_PROVIDER_SITE_OTHER): Payer: Medicare Other | Admitting: Physical Medicine and Rehabilitation

## 2016-09-22 DIAGNOSIS — G8929 Other chronic pain: Secondary | ICD-10-CM | POA: Diagnosis not present

## 2016-09-22 DIAGNOSIS — M25511 Pain in right shoulder: Secondary | ICD-10-CM

## 2016-09-22 MED ORDER — TRIAMCINOLONE ACETONIDE 40 MG/ML IJ SUSP
80.0000 mg | INTRAMUSCULAR | Status: AC | PRN
  Administered 2016-09-22: 80 mg via INTRA_ARTICULAR

## 2016-09-22 MED ORDER — BUPIVACAINE HCL 0.5 % IJ SOLN
3.0000 mL | INTRAMUSCULAR | Status: AC | PRN
Start: 2016-09-22 — End: 2016-09-22
  Administered 2016-09-22: 3 mL via INTRA_ARTICULAR

## 2016-09-22 NOTE — Patient Instructions (Signed)

## 2016-09-22 NOTE — Progress Notes (Deleted)
Right shoulder pain for several years and gradually gotten worse. Pain down arm to elbow. Denies any neck pain.

## 2016-09-22 NOTE — Progress Notes (Signed)
Kendra [name] - 76 y.o. female MRN [id number]  Date of birth: 1940/01/30  Office Visit Note: Visit Date: 09/22/2016 PCP: Kendra Marry, MD Referred by: Kendra [name], *  Subjective: Chief Complaint  Patient presents with  . Right Shoulder - Pain   HPI: Kendra [name] is a 76 year old female with chronic worsening right shoulder pain and decreased range of motion. She is followed by Dr. Ninfa Linden he requests a diagnostic and therapeutic anesthetic arthrogram of the right glenohumeral joint. The patient reports having a prior injection. Her aspect but with possible x-ray guidance. She reported getting 6 weeks of relief with that particular injection.    ROS Otherwise per HPI.  Assessment & Plan: Visit Diagnoses:  1. Chronic right shoulder pain     Plan: Findings:  Diagnostic and hopefully therapeutic anesthetic glenohumeral joint arthrogram. Patient did get increased range of motion and pain relief during the anesthetic phase.    Meds & Orders: No orders of the defined types were placed in this encounter.   Orders Placed This Encounter  Procedures  . Large Joint Injection/Arthrocentesis  . XR C-ARM NO REPORT    Follow-up: Return if symptoms worsen or fail to improve, for Dr. Ninfa Linden.   Procedures: Large Joint Inj Date/Time: 09/22/2016 10:58 AM Performed by: Magnus Sinning Authorized by: Magnus Sinning   Consent Given by:  Patient Site marked: the procedure site was marked   Timeout: prior to procedure the correct patient, procedure, and site was verified   Indications:  Pain and diagnostic evaluation Location:  Shoulder Site:  R glenohumeral Prep: patient was prepped and draped in usual sterile fashion   Needle Size:  22 G Needle Length:  3.5 inches Approach:  Anteromedial Ultrasound Guidance: No   Fluoroscopic Guidance: No   Arthrogram: Yes   Medications:  80 mg triamcinolone acetonide 40 MG/ML; 3 mL bupivacaine 0.5 % Aspiration Attempted: No     Patient tolerance:  Patient tolerated the procedure well with no immediate complications  Arthrogram demonstrated excellent flow of contrast throughout the joint surface without extravasation or obvious defect.  The patient had relief of symptoms during the anesthetic phase of the injection.     No notes on file   Clinical History: No specialty comments available.  She reports that she has quit smoking. She has never used smokeless tobacco.   Recent Labs  05/14/16 1140  HGBA1C 5.1    Objective:  VS:  HT:    WT:   BMI:     BP:   HR: bpm  TEMP: ( )  RESP:  Physical Exam  Musculoskeletal:  Decreased range of motion with shoulder extension and abduction. Painful range of motion impingement.    Ortho Exam Imaging: Xr C-arm No Report  Result Date: 09/22/2016 Please see Notes or Procedures tab for imaging impression.   Past Medical/Family/Surgical/Social History: Medications & Allergies reviewed per EMR Patient Active Problem List   Diagnosis Date Noted  . Chronic right shoulder pain 09/08/2016  . Primary osteoarthritis of right shoulder 07/08/2016  . Right shoulder pain 05/19/2016  . 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 History of colonic polyps 05/15/2015  . Hyperlipidemia 05/15/2015  . Solitary pulmonary nodule 09/15/2013  . Wears hearing aid 08/05/2012  . Essential hypertension, benign 08/05/2012  . Multinodular goiter 08/12/2011  . THYROID NODULE 11/23/2008  . INCONTINENCE, URGE 11/23/2008  . Asymptomatic postmenopausal status 11/23/2008   Past Medical History:  Diagnosis Date  . High cholesterol   . Hypertension  No family history on file. Past Surgical History:  Procedure Laterality Date  . ABDOMINAL HYSTERECTOMY    . KNEE SURGERY     Social History   Occupational History  . Not on file.   Social History Main Topics  . Smoking status: Former Research scientist (life sciences)  . Smokeless tobacco: Never Used     Comment: quit 30 years ago  . Alcohol use Yes  . Drug use: No   . Sexual activity: Not on file

## 2016-10-09 ENCOUNTER — Ambulatory Visit (INDEPENDENT_AMBULATORY_CARE_PROVIDER_SITE_OTHER): Payer: BLUE CROSS/BLUE SHIELD | Admitting: Orthopedic Surgery

## 2016-10-13 ENCOUNTER — Ambulatory Visit (INDEPENDENT_AMBULATORY_CARE_PROVIDER_SITE_OTHER): Payer: BLUE CROSS/BLUE SHIELD | Admitting: Orthopedic Surgery

## 2016-10-18 ENCOUNTER — Other Ambulatory Visit: Payer: Self-pay | Admitting: Family Medicine

## 2016-10-29 ENCOUNTER — Encounter (INDEPENDENT_AMBULATORY_CARE_PROVIDER_SITE_OTHER): Payer: Self-pay | Admitting: Orthopedic Surgery

## 2016-10-29 ENCOUNTER — Ambulatory Visit (INDEPENDENT_AMBULATORY_CARE_PROVIDER_SITE_OTHER): Payer: Medicare Other | Admitting: Orthopedic Surgery

## 2016-10-29 DIAGNOSIS — M19011 Primary osteoarthritis, right shoulder: Secondary | ICD-10-CM

## 2016-10-29 NOTE — Progress Notes (Signed)
Office Visit Note   Patient: Kendra [name]           Date of Birth: 1940/07/05           MRN: [id number] Visit Date: 10/29/2016 Requested by: Kendra [name], [address] [place], [place] [address] PCP: Kendra Marry, MD  Subjective: Chief Complaint  Patient presents with  . Right Shoulder - Pain    HPI: Kendra [name] is a 76 year old female with right shoulder pain.  She had a cortisone injection 6 weeks ago with Dr. Ernestina [name] which continues to work well.  All radiographs are reviewed and she does have primary glenohumeral arthritis of the right shoulder.  Acromiohumeral distance is maintained.  She does have occasional discomfort at night.  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  Denies any prior history of injury to the shoulder.  She states she was throwing some type of board about 10 years ago and felt a "twinge" in her shoulder at that time but never had anything done about it.              ROS: All systems reviewed are negative as they relate to the chief complaint within the history of present illness.  Patient denies  fevers or chills.   Assessment & Plan: Visit Diagnoses:  1. Primary osteoarthritis of right shoulder     Plan: impression is in the stage right shoulder arthritis with positive response to glenohumeral joint injection 6 weeks ago.  Plan is to continue to see how well Kendra [name] does with nonoperative intervention.  I'll see her back in 4 months.  I think at some point she will need shoulder replacement.  However currently she's doing well and has functional range of motion and fairly minimal pain.  If she wants to get another shoulder injection I encouraged her to call either me or Dr. Ninfa [name] so we can arrange it with Dr. Ernestina [name].  Follow-up with me in 4 months for clinical recheck on her shoulder.  She was fairly much in dire straights prior to the injection in regards to her shoulder pain  Follow-Up Instructions: Return in  about 4 months (around 03/01/2017).   Orders:  No orders of the defined types were placed in this encounter.  No orders of the defined types were placed in this encounter.     Procedures: No procedures performed   Clinical Data: No additional findings.  Objective: Vital Signs: There were no vitals taken for this visit.  Physical Exam:   Constitutional: Patient appears well-developed HEENT:  Head: Normocephalic Eyes:EOM are normal Neck: Normal range of motion Cardiovascular: Normal rate Pulmonary/chest: Effort normal Neurologic: Patient is alert Skin: Skin is warm Psychiatric: Patient has normal mood and affect    Ortho Exam: orthopedic exam demonstrates good cervical spine range of motion she does have a little bit of restricted range of motion on the right compared to the left with about 80 of isolated glenohumeral abduction and about 40 of external rotation on the right and about 145 of forward flexion.  Rotator cuff strength on the right is good.  Range of motion on the left is full.  Slight coarseness with passive range of motion noted on the right-hand side.  Specialty Comments:  No specialty comments available.  Imaging: No results found.   PMFS History: Patient Active Problem List   Diagnosis Date Noted  . Chronic right shoulder pain 09/08/2016  . Primary  osteoarthritis of right shoulder 07/08/2016  . Right shoulder pain 05/19/2016  . 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 History of colonic polyps 05/15/2015  . Hyperlipidemia 05/15/2015  . Solitary pulmonary nodule 09/15/2013  . Wears hearing aid 08/05/2012  . Essential hypertension, benign 08/05/2012  . Multinodular goiter 08/12/2011  . THYROID NODULE 11/23/2008  . INCONTINENCE, URGE 11/23/2008  . Asymptomatic postmenopausal status 11/23/2008   Past Medical History:  Diagnosis Date  . High cholesterol   . Hypertension     No family history on file.  Past Surgical History:  Procedure Laterality Date  .  ABDOMINAL HYSTERECTOMY    . KNEE SURGERY     Social History   Occupational History  . Not on file.   Social History Main Topics  . Smoking status: Former Research scientist (life sciences)  . Smokeless tobacco: Never Used     Comment: quit 30 years ago  . Alcohol use Yes  . Drug use: No  . Sexual activity: Not on file

## 2016-11-04 ENCOUNTER — Telehealth: Payer: Self-pay | Admitting: Family Medicine

## 2016-11-04 NOTE — Telephone Encounter (Signed)
Called pt 11/04/16 @ 3:10pm lvm on [phone]) for pt to call back and reschedule appt Dr. is does not work Friday mornings-

## 2016-11-13 ENCOUNTER — Encounter: Payer: Self-pay | Admitting: Family Medicine

## 2016-11-13 ENCOUNTER — Ambulatory Visit (INDEPENDENT_AMBULATORY_CARE_PROVIDER_SITE_OTHER): Payer: Medicare Other | Admitting: Family Medicine

## 2016-11-13 VITALS — BP 114/68 | HR 77 | Ht 65.0 in | Wt 165.0 lb

## 2016-11-13 DIAGNOSIS — E785 Hyperlipidemia, unspecified: Secondary | ICD-10-CM | POA: Diagnosis not present

## 2016-11-13 DIAGNOSIS — J301 Allergic rhinitis due to pollen: Secondary | ICD-10-CM

## 2016-11-13 DIAGNOSIS — I10 Essential (primary) hypertension: Secondary | ICD-10-CM | POA: Diagnosis not present

## 2016-11-13 DIAGNOSIS — Z23 Encounter for immunization: Secondary | ICD-10-CM

## 2016-11-13 DIAGNOSIS — J309 Allergic rhinitis, unspecified: Secondary | ICD-10-CM | POA: Insufficient documentation

## 2016-11-13 NOTE — Progress Notes (Signed)
Subjective:    CC: HTN, lipids  HPI:  Hypertension- Pt denies chest pain, SOB, dizziness, or heart palpitations.  Taking meds as directed w/o problems.  Denies medication side effects.    Hyperlipidemia -currently on atorvastatin 40 mg daily.  Tolerating well without any side effects or myalgias.  Also on Vascepa for her triglycerides.  F/U allerigic rhinitis - she  Has year round allergies.   Past medical history, Surgical history, Family history not pertinant except as noted below, Social history, Allergies, and medications have been entered into the medical record, reviewed, and corrections made.   Review of Systems: No fevers, chills, night sweats, weight loss, chest pain, or shortness of breath.   Objective:    General: Well Developed, well nourished, and in no acute distress.  Neuro: Alert and oriented x3, extra-ocular muscles intact, sensation grossly intact.  HEENT: Normocephalic, atraumatic  Skin: Warm and dry, no rashes. Cardiac: Regular rate and rhythm, no murmurs rubs or gallops, no lower extremity edema.  Respiratory: Clear to auscultation bilaterally. Not using accessory muscles, speaking in full sentences.   Impression and Recommendations:    HTN - Well controlled. Continue current regimen. Follow up in  6 months.    Hyperlipidemia -due for repeat labs today.  Will call with results once available.  Continue with statin and Vascepa.  AR -year-round allergies.  Patient will have pharmacy call if she needs a refill on her Flonase.  Right now she is doing okay on it.

## 2016-11-14 ENCOUNTER — Ambulatory Visit: Payer: Medicare Other | Admitting: Family Medicine

## 2016-11-14 ENCOUNTER — Emergency Department (INDEPENDENT_AMBULATORY_CARE_PROVIDER_SITE_OTHER)
Admission: EM | Admit: 2016-11-14 | Discharge: 2016-11-14 | Disposition: A | Discharge status: Home / Self Care | Payer: Medicare Other | Source: Home / Self Care | Attending: Family Medicine | Admitting: Family Medicine

## 2016-11-14 ENCOUNTER — Encounter: Payer: Self-pay | Admitting: *Deleted

## 2016-11-14 DIAGNOSIS — D1722 Benign lipomatous neoplasm of skin and subcutaneous tissue of left arm: Secondary | ICD-10-CM | POA: Diagnosis not present

## 2016-11-14 DIAGNOSIS — D179 Benign lipomatous neoplasm, unspecified: Secondary | ICD-10-CM

## 2016-11-14 LAB — LIPID PANEL W/REFLEX DIRECT LDL
Cholesterol: 205 mg/dL — ABNORMAL HIGH (ref <200)
HDL: 85 mg/dL (ref >50)
LDL Cholesterol (Calc): 104 mg/dL (calc) — ABNORMAL HIGH
Non-HDL Cholesterol (Calc): 120 mg/dL (calc) (ref <130)
Total CHOL/HDL Ratio: 2.4 (calc) (ref <5.0)
Triglycerides: 70 mg/dL (ref <150)

## 2016-11-14 LAB — COMPLETE METABOLIC PANEL WITH GFR
AG Ratio: 1.5 (calc) (ref 1.0–2.5)
ALT: 13 U/L (ref 6–29)
AST: 15 U/L (ref 10–35)
Albumin: 3.9 g/dL (ref 3.6–5.1)
Alkaline phosphatase (APISO): 69 U/L (ref 33–130)
BUN/Creatinine Ratio: 22 (calc) (ref 6–22)
BUN: 24 mg/dL (ref 7–25)
CO2: 25 mmol/L (ref 20–32)
Calcium: 9.7 mg/dL (ref 8.6–10.4)
Chloride: 101 mmol/L (ref 98–110)
Creat: 1.08 mg/dL — ABNORMAL HIGH (ref 0.60–0.93)
GFR, Est African American: 59 mL/min/{1.73_m2} — ABNORMAL LOW (ref >=60)
GFR, Est Non African American: 51 mL/min/{1.73_m2} — ABNORMAL LOW (ref >=60)
Globulin: 2.6 g/dL (calc) (ref 1.9–3.7)
Glucose, Bld: 86 mg/dL (ref 65–99)
Potassium: 3.9 mmol/L (ref 3.5–5.3)
Sodium: 138 mmol/L (ref 135–146)
Total Bilirubin: 0.6 mg/dL (ref 0.2–1.2)
Total Protein: 6.5 g/dL (ref 6.1–8.1)

## 2016-11-14 LAB — TSH: TSH: 1.1 mIU/L (ref 0.40–4.50)

## 2016-11-14 NOTE — Assessment & Plan Note (Signed)
Asymptomatic lipoma, no intervention needed. If she desires excision I am happy to do it here in the office, from a procedural perspective, she does have a moderate sized vein running just volar, we will keep this in mind during excisional procedure. Return to see me as needed.

## 2016-11-14 NOTE — Consult Note (Signed)
   Subjective:    I'm seeing this patient as a consultation for:  Leeroy Cha PA-C   CC: Left wrist mass  HPI: This is a pleasant 76 year old female, she recently noted a mass over her left ulnar wrist, nontender, possibly growing but she is not really sure, no skin changes.  She was seen in urgent care, I was called for further evaluation as well as consideration of imaging.  Past medical history, Surgical history, Family history not pertinant except as noted below, Social history, Allergies, and medications have been entered into the medical record, reviewed, and no changes needed.   Review of Systems: No headache, visual changes, nausea, vomiting, diarrhea, constipation, dizziness, abdominal pain, skin rash, fevers, chills, night sweats, weight loss, swollen lymph nodes, body aches, joint swelling, muscle aches, chest pain, shortness of breath, mood changes, visual or auditory hallucinations.   Objective:   General: Well Developed, well nourished, and in no acute distress.  Neuro:  Extra-ocular muscles intact, able to move all 4 extremities, sensation grossly intact.  Deep tendon reflexes tested were normal. Psych: Alert and oriented, mood congruent with affect. ENT:  Ears and nose appear unremarkable.  Hearing grossly normal. Neck: Unremarkable overall appearance, trachea midline.  No visible thyroid enlargement. Eyes: Conjunctivae and lids appear unremarkable.  Pupils equal and round. Skin: Warm and dry, no rashes noted.  Cardiovascular: Pulses palpable, no extremity edema. Left wrist: There is a 5-6 cm mass, well-defined, movable in the subcutaneous tissues over the ulnar aspect of the wrist.  Nontender, no overlying skin changes. ROM smooth and normal with good flexion and extension and ulnar/radial deviation that is symmetrical with opposite wrist. Palpation is normal over metacarpals, navicular, lunate, and TFCC; tendons without tenderness/ swelling No snuffbox tenderness. No  tenderness over Canal of [place]. Strength 5/5 in all directions without pain. Negative tinel's and phalens signs. Negative Finkelstein sign. Negative Watson's test.  Procedure: Diagnostic Ultrasound of left wrist Device: GE Logiq E  Findings: Isoechoic 4 x 6 x 3 cm mass on the ulnar wrist superficial to the sixth extensor compartment, vein running somewhat volar adjacent to the structure Images permanently stored and available for review in the ultrasound unit.  Impression: Left medial wrist lipoma, 4 x 6 x 3 cm, venous structure just volar.  Impression and Recommendations:   This case required medical decision making of moderate complexity.  Lipoma Asymptomatic lipoma, no intervention needed. If she desires excision I am happy to do it here in the office, from a procedural perspective, she does have a moderate sized vein running just volar, we will keep this in mind during excisional procedure. Return to see me as needed. ___________________________________________ Gwen Her. Dianah Field, M.D., ABFM., CAQSM. Primary Care and [place] [employer] of [employer] [facility]

## 2016-11-14 NOTE — ED Triage Notes (Signed)
Patient c/o left wrist swelling that she noted today. No known injury or bite.

## 2016-11-14 NOTE — ED Provider Notes (Signed)
[facility]    CSN: [id number] Arrival date & time: 11/14/16  1501     History   Chief Complaint Chief Complaint  Patient presents with  . Joint Swelling    HPI Kendra [name] is a 76 y.o. female.   HPI  Kendra [name] is a 76 y.o. female presenting to UC with c/o new onset swelling/mass noted on Left wrist, ulnar aspect.  Denies pain or itching.  She noticed the area around 2PM today.  She believes it has become slightly bigginer throughout the afternoon.  She is concerned for a spider bite.  She did see her PCP, Dr. Madilyn Fireman, yesterday for a routine visit and did not notice the area yesterday.  She did get her flu shot.  She is Right hand dominant. Denies known injury.    Past Medical History:  Diagnosis Date  . High cholesterol   . Hypertension     Patient Active Problem List   Diagnosis Date Noted  . Lipoma 11/14/2016  . Allergic rhinitis 11/13/2016  . Chronic right shoulder pain 09/08/2016  . Primary osteoarthritis of right shoulder 07/08/2016  . Right shoulder pain 05/19/2016  . 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 History of colonic polyps 05/15/2015  . Hyperlipidemia 05/15/2015  . Solitary pulmonary nodule 09/15/2013  . Wears hearing aid 08/05/2012  . Essential hypertension, benign 08/05/2012  . Multinodular goiter 08/12/2011  . THYROID NODULE 11/23/2008  . INCONTINENCE, URGE 11/23/2008  . Asymptomatic postmenopausal status 11/23/2008    Past Surgical History:  Procedure Laterality Date  . ABDOMINAL HYSTERECTOMY    . KNEE SURGERY      OB History    No data available       Home Medications    Prior to Admission medications   Medication Sig Start Date End Date Taking? Authorizing Provider  aspirin 81 MG tablet Take 81 mg by mouth daily.    [provider]  atorvastatin (LIPITOR) 40 MG tablet TAKE 1 TABLET(40 MG) BY MOUTH DAILY AT 6 PM 09/11/16   Hali Marry, MD  Calcium Carb-Cholecalciferol (CALCIUM 600+D3 PO) Take 1 tablet by  mouth daily.    [provider]  diclofenac sodium (VOLTAREN) 1 % GEL Apply 4 g topically 4 (four) times daily. To affected joint. 05/19/16   Gregor Hams, MD  Famotidine (PEPCID PO) Take by mouth.    [provider]  FIBER SELECT GUMMIES PO Take by mouth.    [provider]  fluticasone (FLONASE) 50 MCG/ACT nasal spray SHAKE LIQUID AND USE 1 SPRAY IN EACH NOSTRIL EVERY DAY 10/02/15   Hali Marry, MD  losartan-hydrochlorothiazide (HYZAAR) 100-25 MG tablet TAKE 1 TABLET BY MOUTH DAILY 09/11/16   Hali Marry, MD  Probiotic Product (PROBIOTIC DAILY PO) Take 1 tablet by mouth daily.    [provider]  VASCEPA 1 g CAPS TAKE 2 CAPSULES BY MOUTH TWICE DAILY 10/20/16   Hali Marry, MD    Family History History reviewed. No pertinent family history.  Social History Social History  Substance Use Topics  . Smoking status: Former Research scientist (life sciences)  . Smokeless tobacco: Never Used     Comment: quit 30 years ago  . Alcohol use Yes     Allergies   Morphine and Morphine and related   Review of Systems Review of Systems  Musculoskeletal: Positive for joint swelling. Negative for arthralgias and myalgias.  Skin: Negative for color change, rash and wound.  Neurological: Negative for weakness and numbness.  Physical Exam Triage Vital Signs ED Triage Vitals  Enc Vitals Group     BP 11/14/16 1522 122/75     Pulse Rate 11/14/16 1522 78     Resp --      Temp 11/14/16 1522 97.6 F (36.4 C)     Temp Source 11/14/16 1522 Oral     SpO2 11/14/16 1522 98 %     Weight 11/14/16 1523 159 lb (72.1 kg)     Height --      Head Circumference --      Peak Flow --      Pain Score 11/14/16 1523 0     Pain Loc --      Pain Edu? --      Excl. in [place]? --    No data found.   Updated Vital Signs BP 122/75 (BP Location: Left Arm)   Pulse 78   Temp 97.6 F (36.4 C) (Oral)   Wt 159 lb (72.1 kg)   SpO2 98%   BMI 26.46 kg/m   Visual  Acuity Right Eye Distance:   Left Eye Distance:   Bilateral Distance:    Right Eye Near:   Left Eye Near:    Bilateral Near:     Physical Exam  Constitutional: She is oriented to person, place, and time. She appears well-developed and well-nourished. No distress.  HENT:  Head: Normocephalic and atraumatic.  Eyes: EOM are normal.  Neck: Normal range of motion.  Cardiovascular: Normal rate.   Pulmonary/Chest: Effort normal.  Musculoskeletal: Normal range of motion. She exhibits edema. She exhibits no tenderness.  Left wrist: 5-6cm irregular shaped mobile subcutaneous mass on ulnar aspect. Non-tender. Full ROM wrist and fingers.   Neurological: She is alert and oriented to person, place, and time.  Skin: Skin is warm and dry. Capillary refill takes less than 2 seconds. No rash noted. She is not diaphoretic. No erythema.  Left wrist/hand: skin in tact. No ecchymosis or erythema. Left forearm: pin point scab with 32mm area surrounding erythema. Non-tender.   Psychiatric: She has a normal mood and affect. Her behavior is normal.  Nursing note and vitals reviewed.    UC Treatments / Results  Labs (all labs ordered are listed, but only abnormal results are displayed) Labs Reviewed - No data to display  EKG  EKG Interpretation None       Radiology No results found.  Procedures Procedures (including critical care time)  Medications Ordered in UC Medications - No data to display   Initial Impression / Assessment and Plan / UC Course  I have reviewed the triage vital signs and the nursing notes.  Pertinent labs & imaging results that were available during my care of the patient were reviewed by me and considered in my medical decision making (see chart for details).     Left wrist: non-tender mobile mass on ulnar aspect.  Consulted with Dr. Dianah Field, see consult note.  U/S shows mass c/w benign lipoma, has likely been there for prolonged time despite pt just noticing  today.    Reassured pt.  Pt may f/u with Dr. Dianah Field to have mass removed, however, no treatment required at this time.     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 Not  Applicable   Noe Gens, PA-C 11/14/16 1726

## 2016-11-22 ENCOUNTER — Other Ambulatory Visit: Payer: Self-pay | Admitting: Family Medicine

## 2016-12-16 ENCOUNTER — Other Ambulatory Visit: Payer: Self-pay | Admitting: Family Medicine

## 2016-12-18 ENCOUNTER — Other Ambulatory Visit: Payer: Self-pay

## 2016-12-20 ENCOUNTER — Other Ambulatory Visit: Payer: Self-pay | Admitting: Family Medicine

## 2017-03-16 ENCOUNTER — Other Ambulatory Visit: Payer: Self-pay | Admitting: Family Medicine

## 2017-04-06 ENCOUNTER — Telehealth (INDEPENDENT_AMBULATORY_CARE_PROVIDER_SITE_OTHER): Payer: Self-pay | Admitting: Physical Medicine and Rehabilitation

## 2017-04-06 NOTE — Telephone Encounter (Signed)
ok 

## 2017-04-07 NOTE — Telephone Encounter (Signed)
Scheduled for 04/09/17 at 1500.

## 2017-04-09 ENCOUNTER — Encounter (INDEPENDENT_AMBULATORY_CARE_PROVIDER_SITE_OTHER): Payer: Self-pay | Admitting: Physical Medicine and Rehabilitation

## 2017-04-09 ENCOUNTER — Ambulatory Visit (INDEPENDENT_AMBULATORY_CARE_PROVIDER_SITE_OTHER): Payer: BLUE CROSS/BLUE SHIELD

## 2017-04-09 ENCOUNTER — Ambulatory Visit (INDEPENDENT_AMBULATORY_CARE_PROVIDER_SITE_OTHER): Payer: Medicare Other | Admitting: Physical Medicine and Rehabilitation

## 2017-04-09 DIAGNOSIS — M25511 Pain in right shoulder: Secondary | ICD-10-CM

## 2017-04-09 DIAGNOSIS — G8929 Other chronic pain: Secondary | ICD-10-CM | POA: Diagnosis not present

## 2017-04-09 NOTE — Patient Instructions (Signed)

## 2017-04-09 NOTE — Progress Notes (Signed)
   Numeric Pain Rating Scale and Functional Assessment Average Pain 5   In the last MONTH (on 0-10 scale) has pain interfered with the following?  1. General activity like being  able to carry out your everyday physical activities such as walking, climbing stairs, carrying groceries, or moving a chair?  Rating(2)   +Driver, -BT, -Dye Allergies.   

## 2017-04-09 NOTE — Progress Notes (Signed)
Kendra [name] - 77 y.o. female MRN [id number]  Date of birth: May 14, 1940  Office Visit Note: Visit Date: 04/09/2017 PCP: Hali Marry, MD Referred by: Hali Marry, *  Subjective: Chief Complaint  Patient presents with  . Right Shoulder - Pain   HPI: Kendra [name] is a 77 year old female who I last saw in September and completed intra-articular glenohumeral joint anesthetic arthrogram with at least 75% relief for many months.  She reports worsening pain recently.  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  She says Advil eases the pain a little bit.  She comes in today for repeat injection.  She did see Dr. Marlou Sa in the interim who felt like she is going to need a replacement of the joint at some time.  She is a little bit confused about follow-up with him.  She was supposed to have follow-up with him in a few months when he last saw her.  At this point we will see how she does again with the injection and she will follow-up with Dr. Marlou Sa.   ROS Otherwise per HPI.  Assessment & Plan: Visit Diagnoses:  1. Chronic right shoulder pain     Plan: No additional findings.   Meds & Orders: No orders of the defined types were placed in this encounter.   Orders Placed This Encounter  Procedures  . Large Joint Inj: R glenohumeral  . XR C-ARM NO REPORT    Follow-up: Return if symptoms worsen or fail to improve, for Dr. Marlou Sa.   Procedures: Large Joint Inj: R glenohumeral on 04/09/2017 3:28 PM Indications: pain and diagnostic evaluation Details: 22 G 3.5 in needle, fluoroscopy-guided anteromedial approach  Arthrogram: No  Medications: 80 mg triamcinolone acetonide 40 MG/ML; 3 mL bupivacaine 0.5 % Outcome: tolerated well, no immediate complications  There was excellent flow of contrast producing a partial arthrogram of the glenohumeral joint. The patient did have relief of symptoms during the anesthetic phase of the injection. Procedure,  treatment alternatives, risks and benefits explained, specific risks discussed. Consent was given by the patient. Immediately prior to procedure a time out was called to verify the correct patient, procedure, equipment, support staff and site/side marked as required. Patient was prepped and draped in the usual sterile fashion.      No notes on file   Clinical History: No specialty comments available.   She reports that she has quit smoking. She has never used smokeless tobacco.  Recent Labs    05/14/16 1140  HGBA1C 5.1    Objective:  VS:  HT:    WT:   BMI:     BP:   HR: bpm  TEMP: ( )  RESP:  Physical Exam  Musculoskeletal:  Painful range of motion of the right shoulder with impingement sign.  She has decreased range of motion.    Ortho Exam Imaging: No results found.  Past Medical/Family/Surgical/Social History: Medications & Allergies reviewed per EMR, new medications updated. Patient Active Problem List   Diagnosis Date Noted  . Lipoma 11/14/2016  . Allergic rhinitis 11/13/2016  . Chronic right shoulder pain 09/08/2016  . Primary osteoarthritis of right shoulder 07/08/2016  . Right shoulder pain 05/19/2016  . 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 History of colonic polyps 05/15/2015  . Hyperlipidemia 05/15/2015  . Solitary pulmonary nodule 09/15/2013  . Wears hearing aid 08/05/2012  . Essential hypertension, benign 08/05/2012  . Multinodular goiter 08/12/2011  . THYROID NODULE 11/23/2008  .  INCONTINENCE, URGE 11/23/2008  . Asymptomatic postmenopausal status 11/23/2008   Past Medical History:  Diagnosis Date  . High cholesterol   . Hypertension    History reviewed. No pertinent family history. Past Surgical History:  Procedure Laterality Date  . ABDOMINAL HYSTERECTOMY    . KNEE SURGERY     Social History   Occupational History  . Not on file  Tobacco Use  . Smoking status: Former Research scientist (life sciences)  . Smokeless tobacco: Never Used  . Tobacco comment: quit 30 years ago   Substance and Sexual Activity  . Alcohol use: Yes  . Drug use: No  . Sexual activity: Not on file

## 2017-04-17 MED ORDER — TRIAMCINOLONE ACETONIDE 40 MG/ML IJ SUSP
80.0000 mg | INTRAMUSCULAR | Status: AC | PRN
  Administered 2017-04-09: 80 mg via INTRA_ARTICULAR

## 2017-04-17 MED ORDER — BUPIVACAINE HCL 0.5 % IJ SOLN
3.0000 mL | INTRAMUSCULAR | Status: AC | PRN
  Administered 2017-04-09: 3 mL via INTRA_ARTICULAR

## 2017-05-02 ENCOUNTER — Other Ambulatory Visit: Payer: Self-pay | Admitting: Family Medicine

## 2017-05-06 IMAGING — DG DG DXA BONE DENSITY STUDY HL7
3 series · 3 of 3 positions shown · non-contrast
Comparison: None.

CLINICAL DATA: Postmenopausal osteoporosis screening.

EXAM:
DUAL X-RAY ABSORPTIOMETRY (DXA) FOR BONE MINERAL DENSITY

[Series 1: — · right · 1 of 1 slices shown (1 of 3)]
[im 1/1]
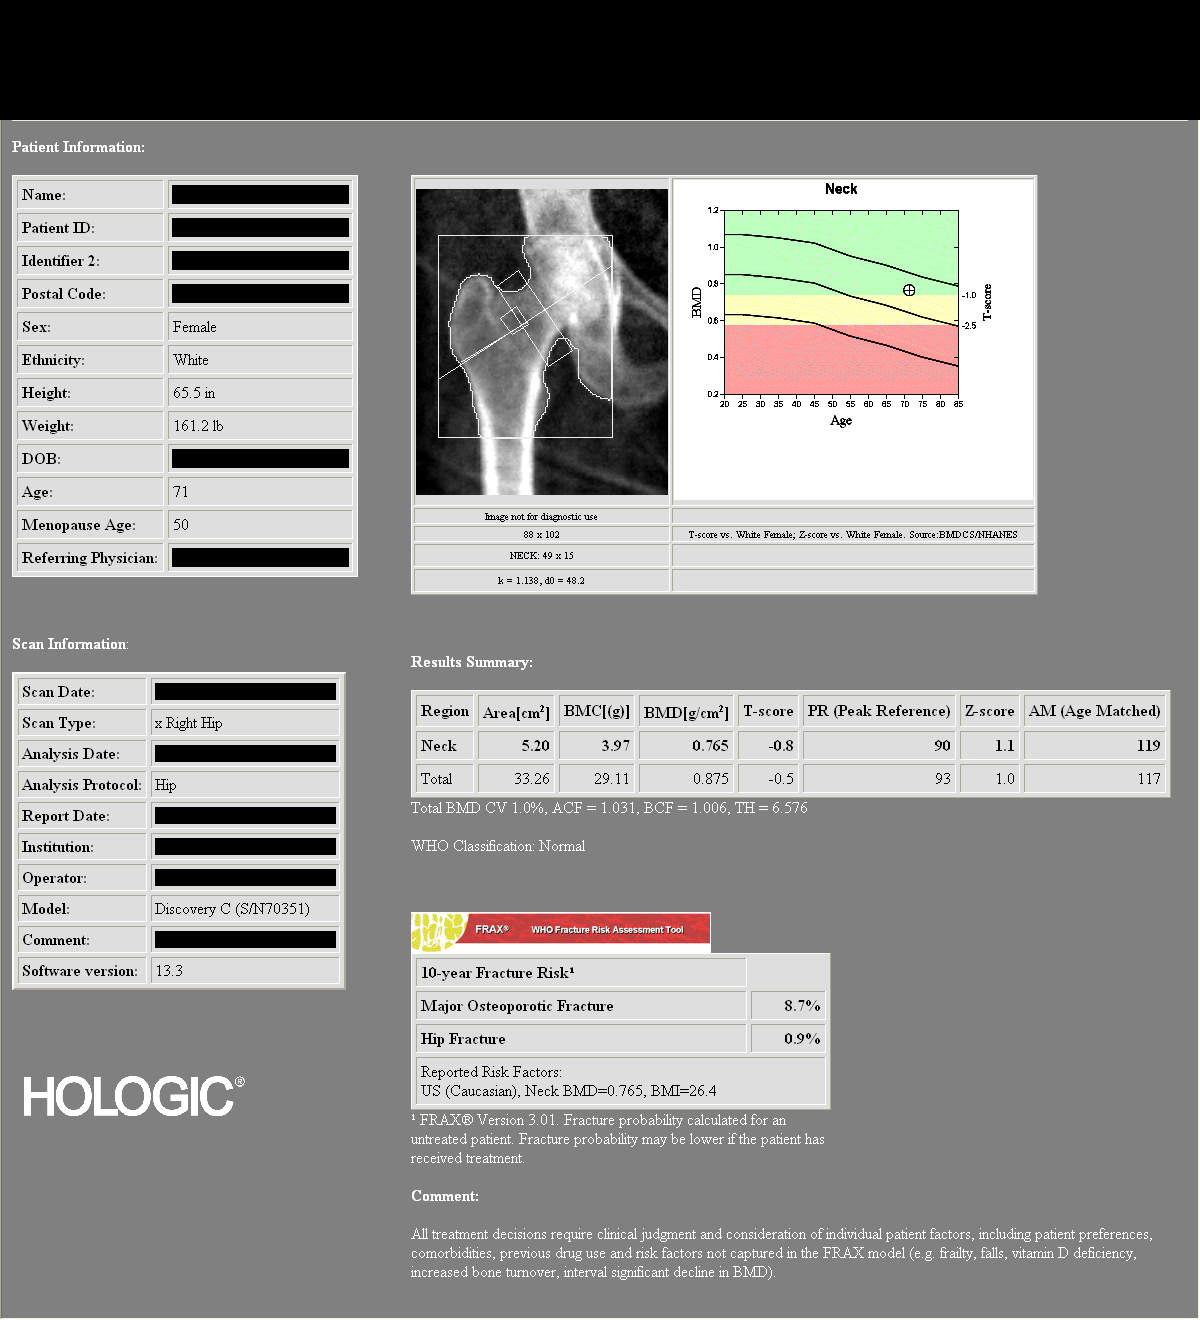

[Series 2: — · left · 1 of 1 slices shown (2 of 3)]
[im 1/1]
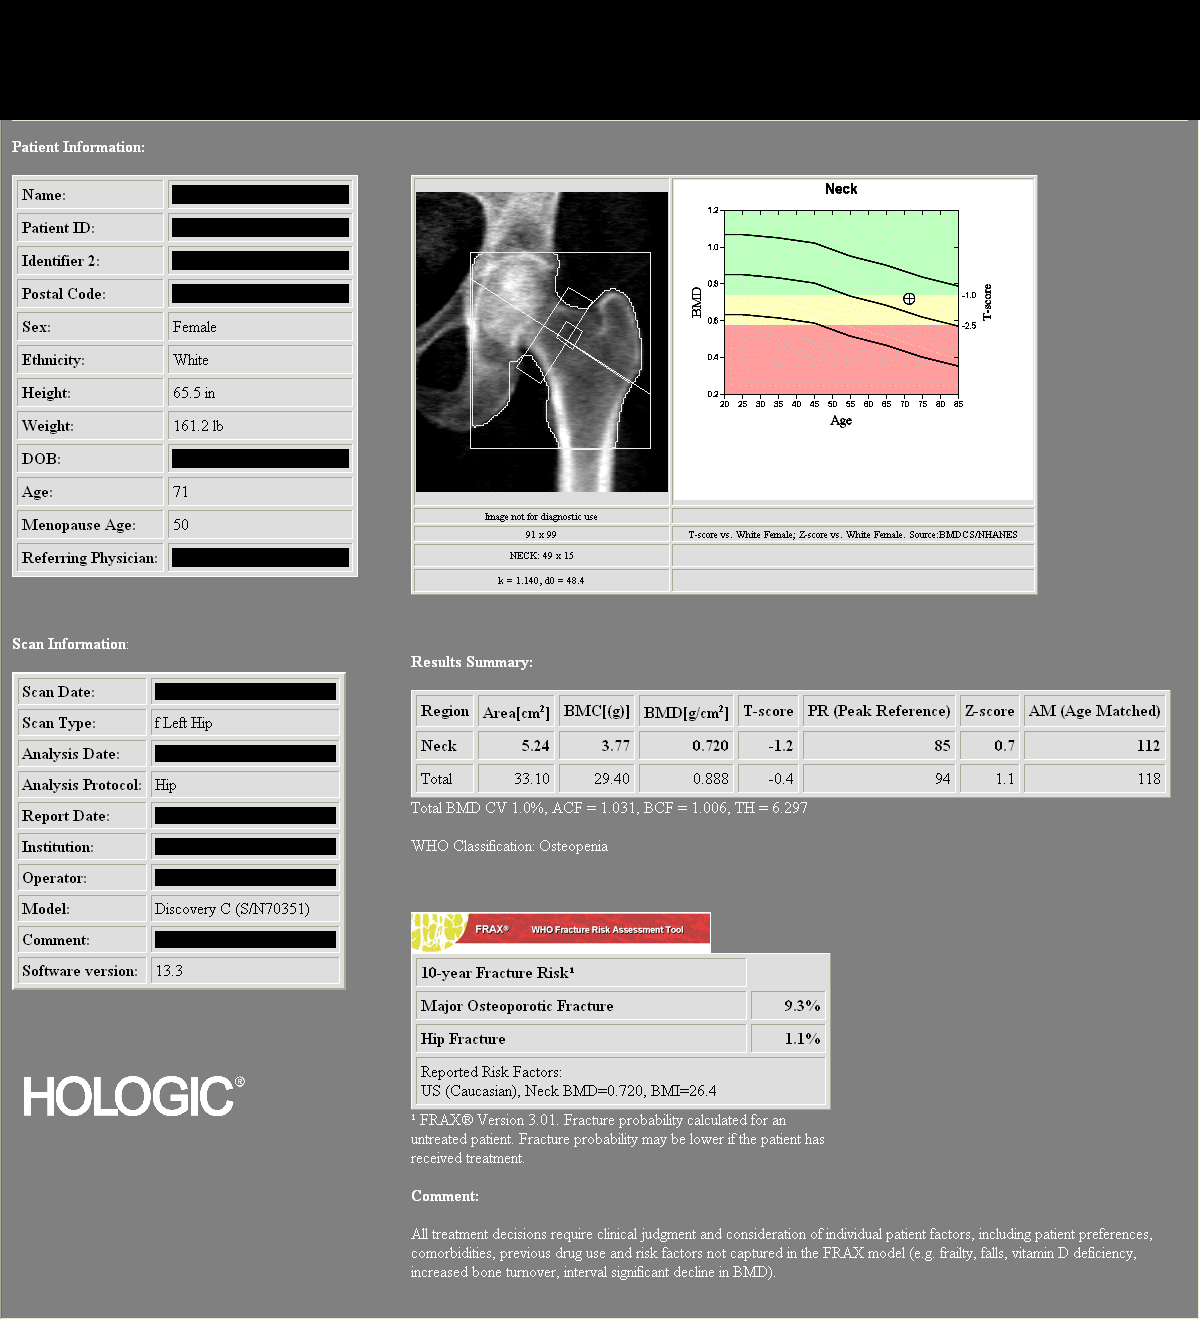

[Series 3: — · 1 of 1 slices shown (3 of 3)]
[im 1/1]
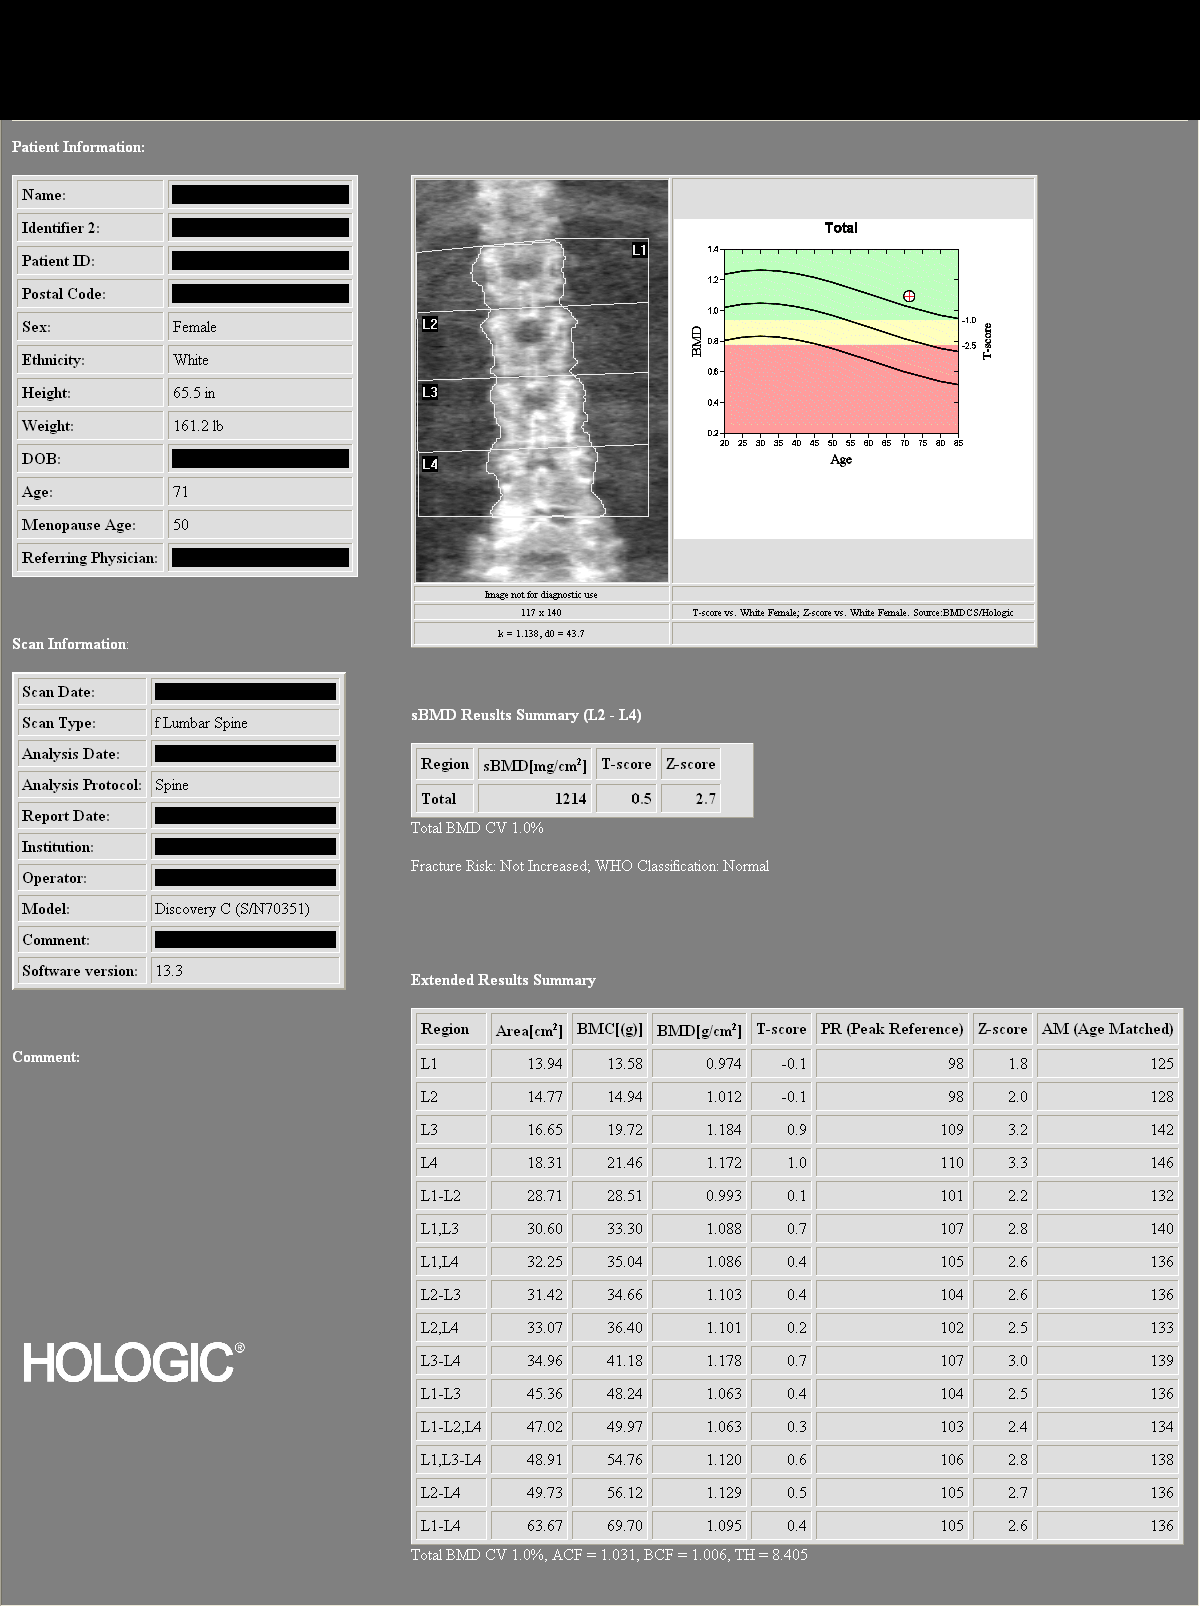

[3 of 3 positions shown; findings below may reference images not displayed]

FINDINGS: AP LUMBAR SPINE L1-L4

Bone Mineral Density (BMD):  1.095 g/cm2

Young Adult T-Score:

Z-Score:

Left FEMUR neck

Bone Mineral Density (BMD):  0.720 g/cm2

Young Adult T-Score: -1 2

Z-Score:

ASSESSMENT: Patient's diagnostic category is LOW BONE MASS by WHO
Criteria.

FRACTURE RISK: INCREASED.

FRAX: Based on the World Health Organization FRAX model, the 10 year
probability of a major osteoporotic fracture is 9.3%. The 10 year
probability of a hip fracture is 1.1 present.
Effective therapies are available in the form of bisphosphonates,
selective estrogen receptor modulators, biologic agents, and hormone
replacement therapy (for women). All patients should ensure an
adequate intake of dietary calcium (1966 mg daily) and vitamin D
(800 Razia Noonan) unless contraindicated.

All treatment decisions require clinical judgment and consideration
of individual patient factors, including patient preferences,
co-morbidities, previous drug use, risk factors not captured in the
FRAX model (e.g., frailty, falls, vitamin D deficiency, increased
bone turnover, interval significant decline in bone density) and
possible under- or over-estimation of fracture risk by FRAX.

The National Osteoporosis Foundation recommends that FDA-approved
medical therapies be considered in postmenopausal women and men age
50 or older with a:

1. Hip or vertebral (clinical or morphometric) fracture.

2. T-score of -2.5 or lower at the spine or hip.

3. Ten-year fracture probability by FRAX of 3% or greater for hip
fracture or 20% or greater for major osteoporotic fracture.

People with diagnosed cases of osteoporosis or at high risk for
fracture should have regular bone mineral density tests. For
patients eligible for Medicare, routine testing is allowed once
every 2 years. The testing frequency can be increased to one year
for patients who have rapidly progressing disease, those who are
receiving or discontinuing medical therapy to restore bone mass, or
have additional risk factors.

World Health Organization (WHO) Criteria:

Normal: T-scores from +1.0 to -1.0

Low Bone Mass (Osteopenia): T-scores between -1.0 and -2.5

Osteoporosis: T-scores -2.5 and below

Comparison to Reference Population:

T-score is the key measure used in the diagnosis of osteoporosis and
relative risk determination for fracture. It provides a value for
bone mass relative to the mean bone mass of a young adult reference
population expressed in terms of standard deviation (SD).

Z-score is the age-matched score showing the patient's values
compared to a population matched for age, sex, and race. This is
also expressed in terms of standard deviation. The patient may have
values that compare favorably to the age-matched values and still be
at increased risk for fracture.

## 2017-05-13 ENCOUNTER — Ambulatory Visit (INDEPENDENT_AMBULATORY_CARE_PROVIDER_SITE_OTHER): Payer: Medicare Other | Admitting: Family Medicine

## 2017-05-13 ENCOUNTER — Other Ambulatory Visit: Payer: Self-pay | Admitting: Family Medicine

## 2017-05-13 ENCOUNTER — Encounter: Payer: Self-pay | Admitting: Family Medicine

## 2017-05-13 VITALS — BP 105/64 | HR 76 | Ht 65.0 in | Wt 159.0 lb

## 2017-05-13 DIAGNOSIS — N183 Chronic kidney disease, stage 3 unspecified: Secondary | ICD-10-CM | POA: Insufficient documentation

## 2017-05-13 DIAGNOSIS — I10 Essential (primary) hypertension: Secondary | ICD-10-CM | POA: Diagnosis not present

## 2017-05-13 DIAGNOSIS — Z1239 Encounter for other screening for malignant neoplasm of breast: Secondary | ICD-10-CM
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
## 2017-05-14 LAB — BASIC METABOLIC PANEL WITH GFR
BUN/Creatinine Ratio: 27 (calc) — ABNORMAL HIGH (ref 6–22)
BUN: 29 mg/dL — ABNORMAL HIGH (ref 7–25)
CO2: 27 mmol/L (ref 20–32)
Calcium: 10.1 mg/dL (ref 8.6–10.4)
Chloride: 102 mmol/L (ref 98–110)
Creat: 1.06 mg/dL — ABNORMAL HIGH (ref 0.60–0.93)
GFR, Est African American: 60 mL/min/{1.73_m2} (ref >=60)
GFR, Est Non African American: 52 mL/min/{1.73_m2} — ABNORMAL LOW (ref >=60)
Glucose, Bld: 100 mg/dL — ABNORMAL HIGH (ref 65–99)
Potassium: 3.8 mmol/L (ref 3.5–5.3)
Sodium: 139 mmol/L (ref 135–146)

## 2017-05-27 ENCOUNTER — Ambulatory Visit (INDEPENDENT_AMBULATORY_CARE_PROVIDER_SITE_OTHER): Payer: Medicare Other

## 2017-05-27 DIAGNOSIS — Z1239 Encounter for other screening for malignant neoplasm of breast: Secondary | ICD-10-CM

## 2017-05-27 DIAGNOSIS — Z1231 Encounter for screening mammogram for malignant neoplasm of breast: Secondary | ICD-10-CM | POA: Diagnosis not present

## 2017-05-27 DIAGNOSIS — R928 Other abnormal and inconclusive findings on diagnostic imaging of breast: Secondary | ICD-10-CM

## 2017-05-28 ENCOUNTER — Other Ambulatory Visit: Payer: Self-pay | Admitting: Family Medicine

## 2017-05-28 DIAGNOSIS — R928 Other abnormal and inconclusive findings on diagnostic imaging of breast: Secondary | ICD-10-CM

## 2017-06-02 ENCOUNTER — Telehealth: Payer: Self-pay

## 2017-06-02 ENCOUNTER — Ambulatory Visit
Admission: RE | Admit: 2017-06-02 | Discharge: 2017-06-02 | Disposition: A | Discharge status: Home / Self Care | Payer: BLUE CROSS/BLUE SHIELD | Source: Ambulatory Visit | Attending: Family Medicine | Admitting: Family Medicine

## 2017-06-02 ENCOUNTER — Other Ambulatory Visit: Payer: Self-pay | Admitting: Family Medicine

## 2017-06-02 DIAGNOSIS — N6311 Unspecified lump in the right breast, upper outer quadrant: Secondary | ICD-10-CM | POA: Diagnosis not present

## 2017-06-02 DIAGNOSIS — R922 Inconclusive mammogram: Secondary | ICD-10-CM | POA: Diagnosis not present

## 2017-06-02 DIAGNOSIS — R928 Other abnormal and inconclusive findings on diagnostic imaging of breast: Secondary | ICD-10-CM

## 2017-06-02 DIAGNOSIS — N6310 Unspecified lump in the right breast, unspecified quadrant: Secondary | ICD-10-CM

## 2017-06-02 NOTE — Telephone Encounter (Signed)
Received letter from [facility] stating that pt's Losartan may have been affected by recall.   Contacted pt and she states that she has been in touch with her pharmacy and she was advised by them none of her medications have been affected.  Pt staying on same therapy at this time since medication is still available.  No needs nor concerns at this time.  Letter scanned to pt's chart.

## 2017-06-10 ENCOUNTER — Other Ambulatory Visit: Payer: Self-pay | Admitting: Family Medicine

## 2017-06-10 ENCOUNTER — Ambulatory Visit
Admission: RE | Admit: 2017-06-10 | Discharge: 2017-06-10 | Disposition: A | Discharge status: Home / Self Care | Payer: BLUE CROSS/BLUE SHIELD | Source: Ambulatory Visit | Attending: Family Medicine | Admitting: Family Medicine

## 2017-06-10 DIAGNOSIS — N63 Unspecified lump in unspecified breast: Secondary | ICD-10-CM | POA: Diagnosis not present

## 2017-06-10 DIAGNOSIS — N6310 Unspecified lump in the right breast, unspecified quadrant: Secondary | ICD-10-CM

## 2017-06-10 DIAGNOSIS — C50411 Malignant neoplasm of upper-outer quadrant of right female breast: Secondary | ICD-10-CM | POA: Diagnosis not present

## 2017-06-10 DIAGNOSIS — N6311 Unspecified lump in the right breast, upper outer quadrant: Secondary | ICD-10-CM | POA: Diagnosis not present

## 2017-06-11 ENCOUNTER — Telehealth: Payer: Self-pay

## 2017-06-11 DIAGNOSIS — C50919 Malignant neoplasm of unspecified site of unspecified female breast: Secondary | ICD-10-CM

## 2017-06-11 NOTE — Telephone Encounter (Signed)
Pt contacted and given new.  Referral placed. Awaiting final bx results

## 2017-06-11 NOTE — Telephone Encounter (Signed)
Angie from the [facility] called and states patient is positive for breast cancer. She wanted to know if they should call or does Dr Madilyn Fireman want to call patient with the results. Angie did state the patient would like a Psychologist, sport and exercise in [place].

## 2017-06-11 NOTE — Telephone Encounter (Signed)
I will call her, I think she might do better if I deliver the news.  Just route back to me.

## 2017-06-11 NOTE — Telephone Encounter (Signed)
Grade 1 and 2 mammary carcinoma

## 2017-06-12 ENCOUNTER — Other Ambulatory Visit: Payer: Self-pay | Admitting: Family Medicine

## 2017-06-16 ENCOUNTER — Telehealth: Payer: Self-pay

## 2017-06-16 NOTE — Telephone Encounter (Signed)
Please check under referrals tab.  The referral was placed.  May need to check with [facility] or we can call their office directly and find out why they have not contacted the patient yet.  "Sent referral with ov notes and insurance to [facility] at [phone] and [phone]. They will call and schedule with patient for good appointment time - CF"

## 2017-06-16 NOTE — Telephone Encounter (Signed)
Got appt scheduled for June 19th at 10:00am with Dr. Alvino Chapel. [place], [place]

## 2017-07-01 DIAGNOSIS — C50911 Malignant neoplasm of unspecified site of right female breast: Secondary | ICD-10-CM | POA: Diagnosis not present

## 2017-07-02 DIAGNOSIS — Z170 Estrogen receptor positive status [ER+]: Secondary | ICD-10-CM | POA: Insufficient documentation

## 2017-07-02 DIAGNOSIS — C50911 Malignant neoplasm of unspecified site of right female breast: Secondary | ICD-10-CM | POA: Insufficient documentation

## 2017-07-03 DIAGNOSIS — C50411 Malignant neoplasm of upper-outer quadrant of right female breast: Secondary | ICD-10-CM | POA: Diagnosis not present

## 2017-07-08 DIAGNOSIS — K219 Gastro-esophageal reflux disease without esophagitis: Secondary | ICD-10-CM | POA: Diagnosis not present

## 2017-07-08 DIAGNOSIS — Z79899 Other long term (current) drug therapy: Secondary | ICD-10-CM | POA: Diagnosis not present

## 2017-07-08 DIAGNOSIS — Z170 Estrogen receptor positive status [ER+]: Secondary | ICD-10-CM | POA: Diagnosis not present

## 2017-07-08 DIAGNOSIS — Z885 Allergy status to narcotic agent status: Secondary | ICD-10-CM | POA: Diagnosis not present

## 2017-07-08 DIAGNOSIS — Z803 Family history of malignant neoplasm of breast: Secondary | ICD-10-CM | POA: Diagnosis not present

## 2017-07-08 DIAGNOSIS — I10 Essential (primary) hypertension: Secondary | ICD-10-CM | POA: Diagnosis not present

## 2017-07-08 DIAGNOSIS — Z7982 Long term (current) use of aspirin: Secondary | ICD-10-CM | POA: Diagnosis not present

## 2017-07-08 DIAGNOSIS — Z801 Family history of malignant neoplasm of trachea, bronchus and lung: Secondary | ICD-10-CM | POA: Diagnosis not present

## 2017-07-08 DIAGNOSIS — E785 Hyperlipidemia, unspecified: Secondary | ICD-10-CM | POA: Diagnosis not present

## 2017-07-08 DIAGNOSIS — Z86711 Personal history of pulmonary embolism: Secondary | ICD-10-CM | POA: Diagnosis not present

## 2017-07-08 DIAGNOSIS — Z87891 Personal history of nicotine dependence: Secondary | ICD-10-CM | POA: Diagnosis not present

## 2017-07-08 DIAGNOSIS — C50911 Malignant neoplasm of unspecified site of right female breast: Secondary | ICD-10-CM | POA: Diagnosis not present

## 2017-07-15 DIAGNOSIS — C50911 Malignant neoplasm of unspecified site of right female breast: Secondary | ICD-10-CM | POA: Diagnosis not present

## 2017-07-21 DIAGNOSIS — Z170 Estrogen receptor positive status [ER+]: Secondary | ICD-10-CM | POA: Diagnosis not present

## 2017-07-21 DIAGNOSIS — C50411 Malignant neoplasm of upper-outer quadrant of right female breast: Secondary | ICD-10-CM | POA: Diagnosis not present

## 2017-07-21 DIAGNOSIS — N63 Unspecified lump in unspecified breast: Secondary | ICD-10-CM | POA: Diagnosis not present

## 2017-07-22 DIAGNOSIS — Z86711 Personal history of pulmonary embolism: Secondary | ICD-10-CM | POA: Diagnosis not present

## 2017-07-22 DIAGNOSIS — Z87891 Personal history of nicotine dependence: Secondary | ICD-10-CM | POA: Diagnosis not present

## 2017-07-22 DIAGNOSIS — Z803 Family history of malignant neoplasm of breast: Secondary | ICD-10-CM | POA: Diagnosis not present

## 2017-07-22 DIAGNOSIS — Z9223 Personal history of estrogen therapy: Secondary | ICD-10-CM | POA: Diagnosis not present

## 2017-07-22 DIAGNOSIS — Z79811 Long term (current) use of aromatase inhibitors: Secondary | ICD-10-CM | POA: Diagnosis not present

## 2017-07-22 DIAGNOSIS — C773 Secondary and unspecified malignant neoplasm of axilla and upper limb lymph nodes: Secondary | ICD-10-CM | POA: Diagnosis not present

## 2017-07-22 DIAGNOSIS — Z800 Family history of malignant neoplasm of digestive organs: Secondary | ICD-10-CM | POA: Diagnosis not present

## 2017-07-22 DIAGNOSIS — Z801 Family history of malignant neoplasm of trachea, bronchus and lung: Secondary | ICD-10-CM | POA: Diagnosis not present

## 2017-07-22 DIAGNOSIS — C50911 Malignant neoplasm of unspecified site of right female breast: Secondary | ICD-10-CM | POA: Diagnosis not present

## 2017-07-22 DIAGNOSIS — Z90710 Acquired absence of both cervix and uterus: Secondary | ICD-10-CM | POA: Diagnosis not present

## 2017-07-22 DIAGNOSIS — Z170 Estrogen receptor positive status [ER+]: Secondary | ICD-10-CM | POA: Diagnosis not present

## 2017-07-22 DIAGNOSIS — Z9889 Other specified postprocedural states: Secondary | ICD-10-CM | POA: Diagnosis not present

## 2017-07-25 ENCOUNTER — Other Ambulatory Visit: Payer: Self-pay | Admitting: Family Medicine

## 2017-08-18 DIAGNOSIS — H25813 Combined forms of age-related cataract, bilateral: Secondary | ICD-10-CM | POA: Diagnosis not present

## 2017-08-18 DIAGNOSIS — H527 Unspecified disorder of refraction: Secondary | ICD-10-CM | POA: Diagnosis not present

## 2017-09-02 DIAGNOSIS — Z13820 Encounter for screening for osteoporosis: Secondary | ICD-10-CM | POA: Diagnosis not present

## 2017-09-02 DIAGNOSIS — Z79811 Long term (current) use of aromatase inhibitors: Secondary | ICD-10-CM | POA: Diagnosis not present

## 2017-09-02 DIAGNOSIS — C50911 Malignant neoplasm of unspecified site of right female breast: Secondary | ICD-10-CM | POA: Diagnosis not present

## 2017-09-17 ENCOUNTER — Other Ambulatory Visit: Payer: Self-pay | Admitting: Family Medicine

## 2017-10-02 DIAGNOSIS — Z23 Encounter for immunization: Secondary | ICD-10-CM | POA: Diagnosis not present

## 2017-10-21 ENCOUNTER — Other Ambulatory Visit: Payer: Self-pay | Admitting: Family Medicine

## 2017-10-29 DIAGNOSIS — I10 Essential (primary) hypertension: Secondary | ICD-10-CM | POA: Diagnosis not present

## 2017-10-29 DIAGNOSIS — C50911 Malignant neoplasm of unspecified site of right female breast: Secondary | ICD-10-CM | POA: Diagnosis not present

## 2017-10-29 DIAGNOSIS — K219 Gastro-esophageal reflux disease without esophagitis: Secondary | ICD-10-CM | POA: Diagnosis not present

## 2017-11-13 ENCOUNTER — Ambulatory Visit (INDEPENDENT_AMBULATORY_CARE_PROVIDER_SITE_OTHER): Payer: Medicare Other | Admitting: Family Medicine

## 2017-11-13 ENCOUNTER — Encounter: Payer: Self-pay | Admitting: Family Medicine

## 2017-11-13 VITALS — BP 117/63 | HR 63 | Ht 63.39 in | Wt 161.0 lb

## 2017-11-13 DIAGNOSIS — E041 Nontoxic single thyroid nodule: Secondary | ICD-10-CM

## 2017-11-13 DIAGNOSIS — E785 Hyperlipidemia, unspecified: Secondary | ICD-10-CM

## 2017-11-13 DIAGNOSIS — I2699 Other pulmonary embolism without acute cor pulmonale: Secondary | ICD-10-CM

## 2017-11-13 DIAGNOSIS — K219 Gastro-esophageal reflux disease without esophagitis: Secondary | ICD-10-CM | POA: Insufficient documentation

## 2017-11-13 DIAGNOSIS — I10 Essential (primary) hypertension: Secondary | ICD-10-CM | POA: Diagnosis not present

## 2017-11-13 DIAGNOSIS — N183 Chronic kidney disease, stage 3 unspecified: Secondary | ICD-10-CM

## 2017-11-13 DIAGNOSIS — Z86711 Personal history of pulmonary embolism: Secondary | ICD-10-CM | POA: Insufficient documentation

## 2017-11-13 NOTE — Progress Notes (Signed)
Subjective:    CC:   HPI:  Hypertension- Pt denies chest pain, SOB, dizziness, or heart palpitations.  Taking meds as directed w/o problems.  Denies medication side effects.    Hyperlipidemia-currently on atorvastatin tolerating well without any side effects or myalgias.  F/U CKD - no recent changes.   Past medical history, Surgical history, Family history not pertinant except as noted below, Social history, Allergies, and medications have been entered into the medical record, reviewed, and corrections made.   Review of Systems: No fevers, chills, night sweats, weight loss, chest pain, or shortness of breath.   Objective:    General: Well Developed, well nourished, and in no acute distress.  Neuro: Alert and oriented x3, extra-ocular muscles intact, sensation grossly intact.  HEENT: Normocephalic, atraumatic  Skin: Warm and dry, no rashes. Cardiac: Regular rate and rhythm, no murmurs rubs or gallops, no lower extremity edema.  Respiratory: Clear to auscultation bilaterally. Not using accessory muscles, speaking in full sentences.   Impression and Recommendations:    HTN - Well controlled. Continue current regimen. Follow up in  6 months.  Due for screening labs.    Hyperlidemia - due to recheck lipids.    CKD 3 - Due recheck renal function.    Thyroid nodule - recheck TSH.

## 2017-12-03 ENCOUNTER — Other Ambulatory Visit: Payer: Self-pay

## 2017-12-23 ENCOUNTER — Other Ambulatory Visit: Payer: Self-pay | Admitting: Family Medicine

## 2017-12-24 ENCOUNTER — Telehealth: Payer: Self-pay

## 2017-12-24 NOTE — Telephone Encounter (Signed)
[facility] requesting alternate RX be sent in for Losartan/HCTZ combo since it is on backorder.  OK to send 2 RXs of separated medication? RX pended, please advise if ok

## 2017-12-27 MED ORDER — LOSARTAN POTASSIUM 100 MG PO TABS: 100.0000 mg | ORAL_TABLET | Freq: Every day | ORAL | 0 refills | Status: DC

## 2017-12-27 MED ORDER — HYDROCHLOROTHIAZIDE 25 MG PO TABS: 25.0000 mg | ORAL_TABLET | Freq: Every day | ORAL | 0 refills | Status: DC

## 2017-12-27 NOTE — Telephone Encounter (Signed)
Rxs sent

## 2017-12-28 NOTE — Telephone Encounter (Signed)
Pt advised of changes

## 2018-01-30 ENCOUNTER — Other Ambulatory Visit: Payer: Self-pay | Admitting: Family Medicine

## 2018-02-02 MED ORDER — ATORVASTATIN CALCIUM 40 MG PO TABS: 40.0000 mg | ORAL_TABLET | Freq: Every day | ORAL | 0 refills | Status: DC

## 2018-02-28 ENCOUNTER — Other Ambulatory Visit: Payer: Self-pay | Admitting: Family Medicine

## 2018-03-12 ENCOUNTER — Other Ambulatory Visit: Payer: Self-pay | Admitting: Family Medicine

## 2018-03-24 ENCOUNTER — Other Ambulatory Visit: Payer: Self-pay | Admitting: Family Medicine

## 2018-04-07 DIAGNOSIS — Z86711 Personal history of pulmonary embolism: Secondary | ICD-10-CM | POA: Diagnosis not present

## 2018-04-07 DIAGNOSIS — Z79811 Long term (current) use of aromatase inhibitors: Secondary | ICD-10-CM | POA: Diagnosis not present

## 2018-04-07 DIAGNOSIS — D7589 Other specified diseases of blood and blood-forming organs: Secondary | ICD-10-CM | POA: Diagnosis not present

## 2018-04-07 DIAGNOSIS — Z5181 Encounter for therapeutic drug level monitoring: Secondary | ICD-10-CM | POA: Insufficient documentation

## 2018-04-07 DIAGNOSIS — I10 Essential (primary) hypertension: Secondary | ICD-10-CM | POA: Diagnosis not present

## 2018-04-07 DIAGNOSIS — E785 Hyperlipidemia, unspecified: Secondary | ICD-10-CM | POA: Diagnosis not present

## 2018-04-07 DIAGNOSIS — K219 Gastro-esophageal reflux disease without esophagitis: Secondary | ICD-10-CM | POA: Diagnosis not present

## 2018-04-07 DIAGNOSIS — C50911 Malignant neoplasm of unspecified site of right female breast: Secondary | ICD-10-CM | POA: Diagnosis not present

## 2018-04-07 DIAGNOSIS — Z170 Estrogen receptor positive status [ER+]: Secondary | ICD-10-CM | POA: Diagnosis not present

## 2018-05-17 ENCOUNTER — Telehealth: Payer: BLUE CROSS/BLUE SHIELD | Admitting: Family Medicine

## 2018-05-25 ENCOUNTER — Other Ambulatory Visit: Payer: Self-pay | Admitting: Family Medicine

## 2018-05-25 NOTE — Telephone Encounter (Signed)
lvm asking that pt rtn call to schedule virtual visit for BP/lipids/tsh she will need to have labs done for refill for Vascepa.Maryruth Eve, [place] Crocker, CMA

## 2018-05-27 ENCOUNTER — Encounter: Payer: Self-pay | Admitting: Family Medicine

## 2018-05-27 ENCOUNTER — Ambulatory Visit (INDEPENDENT_AMBULATORY_CARE_PROVIDER_SITE_OTHER): Payer: Medicare Other | Admitting: Family Medicine

## 2018-05-27 VITALS — BP 116/68 | Temp 98.2°F | Ht 63.39 in | Wt 156.4 lb

## 2018-05-27 DIAGNOSIS — E785 Hyperlipidemia, unspecified: Secondary | ICD-10-CM | POA: Diagnosis not present

## 2018-05-27 DIAGNOSIS — D0501 Lobular carcinoma in situ of right breast: Secondary | ICD-10-CM | POA: Diagnosis not present

## 2018-05-27 DIAGNOSIS — N183 Chronic kidney disease, stage 3 unspecified: Secondary | ICD-10-CM

## 2018-05-27 DIAGNOSIS — I10 Essential (primary) hypertension: Secondary | ICD-10-CM | POA: Diagnosis not present

## 2018-05-27 MED ORDER — ICOSAPENT ETHYL 1 G PO CAPS: 2.0000 | ORAL_CAPSULE | Freq: Two times a day (BID) | ORAL | 6 refills | Status: DC

## 2018-05-27 MED ORDER — ICOSAPENT ETHYL 1 G PO CAPS: 2.0000 | ORAL_CAPSULE | Freq: Two times a day (BID) | ORAL | 11 refills | Status: DC

## 2018-05-27 NOTE — Progress Notes (Signed)
Virtual Visit via Telephone Note  I connected with Kendra [name] on 05/27/18 at 10:10 AM EDT by telephone and verified that I am speaking with the correct person using two identifiers.   I discussed the limitations, risks, security and privacy concerns of performing an evaluation and management service by telephone and the availability of in person appointments. I also discussed with the patient that there may be a patient responsible charge related to this service. The patient expressed understanding and agreed to proceed.   Subjective:    CC: 6 mo follow-up  HPI: Hypertension- Pt denies chest pain, SOB, dizziness, or heart palpitations.  Taking meds as directed w/o problems.  Denies medication side effects.  No swelling.    Hyperlipidemia - tolerating stating well with no myalgias or significant side effects.  Lab Results  Component Value Date   CHOL 205 (H) 11/13/2016   CHOL 177 11/15/2015   CHOL 183 11/15/2014   Lab Results  Component Value Date   HDL 85 11/13/2016   HDL 56 11/15/2015   HDL 67 11/15/2014   Lab Results  Component Value Date   LDLCALC 104 (H) 11/13/2016   LDLCALC 101 11/15/2015   LDLCALC 89 11/15/2014   Lab Results  Component Value Date   TRIG 70 11/13/2016   TRIG 98 11/15/2015   TRIG 134 11/15/2014   Lab Results  Component Value Date   CHOLHDL 2.4 11/13/2016   CHOLHDL 3.2 11/15/2015   CHOLHDL 2.7 11/15/2014   Lab Results  Component Value Date   LDLDIRECT 111 (H) 11/24/2008    Follow-up CKD 3-no recent changes to urination.She did have some labs done recently with Dr. Georgiann [name] in March showing a kidney function elevation of 1.38.  This is above her typical elevation of 1.1.  She has lobular carcinoma of the right breast and is currently on antiandrogen therapy and followed at [facility] with Dr. Verdell [name].  Following every 3 months.    Past medical history, Surgical history, Family history not pertinant except as noted below, Social  history, Allergies, and medications have been entered into the medical record, reviewed, and corrections made.   Review of Systems: No fevers, chills, night sweats, weight loss, chest pain, or shortness of breath.   Objective:    General: Speaking clearly in complete sentences without any shortness of breath.  Alert and oriented x3.  Normal judgment. No apparent acute distress.    Impression and Recommendations:   HTN - Home BPs well controlled. F/U in 6 months.  She is exercising.    Hyperlipidemia -due to repeat screening lipid.  Recent liver enzymes are actually up-to-date.  CKD 3 -recommend that we repeat renal function because it was elevated in March with oncology as well as do a urine microalbumin.  Lobular carcinoma of the right breast- she is doing well on her regimen.      I discussed the assessment and treatment plan with the patient. The patient was provided an opportunity to ask questions and all were answered. The patient agreed with the plan and demonstrated an understanding of the instructions.   The patient was advised to call back or seek an in-person evaluation if the symptoms worsen or if the condition fails to improve as anticipated.  I provided 20 minutes of non-face-to-face time during this encounter.   Kendra Lecher, MD

## 2018-06-25 DIAGNOSIS — N183 Chronic kidney disease, stage 3 (moderate): Secondary | ICD-10-CM | POA: Diagnosis not present

## 2018-06-25 DIAGNOSIS — I10 Essential (primary) hypertension: Secondary | ICD-10-CM | POA: Diagnosis not present

## 2018-06-25 DIAGNOSIS — E785 Hyperlipidemia, unspecified: Secondary | ICD-10-CM | POA: Diagnosis not present

## 2018-06-26 LAB — BASIC METABOLIC PANEL WITH GFR
BUN/Creatinine Ratio: 23 (calc) — ABNORMAL HIGH (ref 6–22)
BUN: 27 mg/dL — ABNORMAL HIGH (ref 7–25)
CO2: 24 mmol/L (ref 20–32)
Calcium: 10.3 mg/dL (ref 8.6–10.4)
Chloride: 103 mmol/L (ref 98–110)
Creat: 1.15 mg/dL — ABNORMAL HIGH (ref 0.60–0.93)
GFR, Est African American: 54 mL/min/{1.73_m2} — ABNORMAL LOW (ref >=60)
GFR, Est Non African American: 47 mL/min/{1.73_m2} — ABNORMAL LOW (ref >=60)
Glucose, Bld: 98 mg/dL (ref 65–99)
Potassium: 4.1 mmol/L (ref 3.5–5.3)
Sodium: 136 mmol/L (ref 135–146)

## 2018-06-26 LAB — LIPID PANEL
Cholesterol: 148 mg/dL (ref <200)
HDL: 53 mg/dL (ref >=50)
LDL Cholesterol (Calc): 79 mg/dL (calc)
Non-HDL Cholesterol (Calc): 95 mg/dL (calc) (ref <130)
Total CHOL/HDL Ratio: 2.8 (calc) (ref <5.0)
Triglycerides: 81 mg/dL (ref <150)

## 2018-06-26 LAB — MICROALBUMIN / CREATININE URINE RATIO
Creatinine, Urine: 61 mg/dL (ref 20–275)
Microalb Creat Ratio: 3 mcg/mg creat (ref <30)
Microalb, Ur: 0.2 mg/dL

## 2018-07-08 DIAGNOSIS — Z170 Estrogen receptor positive status [ER+]: Secondary | ICD-10-CM | POA: Diagnosis not present

## 2018-07-08 DIAGNOSIS — I10 Essential (primary) hypertension: Secondary | ICD-10-CM | POA: Diagnosis not present

## 2018-07-08 DIAGNOSIS — Z86010 Personal history of colonic polyps: Secondary | ICD-10-CM | POA: Diagnosis not present

## 2018-07-08 DIAGNOSIS — Z79899 Other long term (current) drug therapy: Secondary | ICD-10-CM | POA: Diagnosis not present

## 2018-07-08 DIAGNOSIS — Z87891 Personal history of nicotine dependence: Secondary | ICD-10-CM | POA: Diagnosis not present

## 2018-07-08 DIAGNOSIS — C50911 Malignant neoplasm of unspecified site of right female breast: Secondary | ICD-10-CM | POA: Diagnosis not present

## 2018-07-08 DIAGNOSIS — Z79811 Long term (current) use of aromatase inhibitors: Secondary | ICD-10-CM | POA: Diagnosis not present

## 2018-07-08 DIAGNOSIS — Z86711 Personal history of pulmonary embolism: Secondary | ICD-10-CM | POA: Diagnosis not present

## 2018-07-08 DIAGNOSIS — Z5181 Encounter for therapeutic drug level monitoring: Secondary | ICD-10-CM | POA: Diagnosis not present

## 2018-07-13 DIAGNOSIS — R928 Other abnormal and inconclusive findings on diagnostic imaging of breast: Secondary | ICD-10-CM | POA: Diagnosis not present

## 2018-07-13 DIAGNOSIS — Z853 Personal history of malignant neoplasm of breast: Secondary | ICD-10-CM | POA: Diagnosis not present

## 2018-07-13 DIAGNOSIS — C50911 Malignant neoplasm of unspecified site of right female breast: Secondary | ICD-10-CM | POA: Diagnosis not present

## 2018-09-05 ENCOUNTER — Other Ambulatory Visit: Payer: Self-pay | Admitting: Family Medicine

## 2018-10-13 DIAGNOSIS — C50911 Malignant neoplasm of unspecified site of right female breast: Secondary | ICD-10-CM | POA: Diagnosis not present

## 2018-10-13 DIAGNOSIS — I10 Essential (primary) hypertension: Secondary | ICD-10-CM | POA: Diagnosis not present

## 2018-10-20 DIAGNOSIS — Z7183 Encounter for nonprocreative genetic counseling: Secondary | ICD-10-CM | POA: Diagnosis not present

## 2018-10-20 DIAGNOSIS — Z803 Family history of malignant neoplasm of breast: Secondary | ICD-10-CM | POA: Diagnosis not present

## 2018-10-20 DIAGNOSIS — C50911 Malignant neoplasm of unspecified site of right female breast: Secondary | ICD-10-CM | POA: Diagnosis not present

## 2018-10-20 DIAGNOSIS — Z800 Family history of malignant neoplasm of digestive organs: Secondary | ICD-10-CM | POA: Diagnosis not present

## 2018-10-25 DIAGNOSIS — Z23 Encounter for immunization: Secondary | ICD-10-CM | POA: Diagnosis not present

## 2018-10-26 ENCOUNTER — Telehealth: Payer: Self-pay | Admitting: Family Medicine

## 2018-10-26 NOTE — Telephone Encounter (Signed)
b

## 2018-11-01 DIAGNOSIS — H5203 Hypermetropia, bilateral: Secondary | ICD-10-CM | POA: Diagnosis not present

## 2018-11-01 DIAGNOSIS — H52223 Regular astigmatism, bilateral: Secondary | ICD-10-CM | POA: Diagnosis not present

## 2018-11-01 DIAGNOSIS — H524 Presbyopia: Secondary | ICD-10-CM | POA: Diagnosis not present

## 2018-11-01 DIAGNOSIS — H25813 Combined forms of age-related cataract, bilateral: Secondary | ICD-10-CM | POA: Diagnosis not present

## 2018-11-17 DIAGNOSIS — Z1379 Encounter for other screening for genetic and chromosomal anomalies: Secondary | ICD-10-CM | POA: Insufficient documentation

## 2018-12-07 DIAGNOSIS — C4442 Squamous cell carcinoma of skin of scalp and neck: Secondary | ICD-10-CM | POA: Diagnosis not present

## 2018-12-08 ENCOUNTER — Other Ambulatory Visit: Payer: Self-pay

## 2018-12-15 DIAGNOSIS — Z5181 Encounter for therapeutic drug level monitoring: Secondary | ICD-10-CM | POA: Diagnosis not present

## 2018-12-15 DIAGNOSIS — I10 Essential (primary) hypertension: Secondary | ICD-10-CM | POA: Diagnosis not present

## 2018-12-15 DIAGNOSIS — D7589 Other specified diseases of blood and blood-forming organs: Secondary | ICD-10-CM | POA: Diagnosis not present

## 2018-12-15 DIAGNOSIS — C50911 Malignant neoplasm of unspecified site of right female breast: Secondary | ICD-10-CM | POA: Diagnosis not present

## 2018-12-15 DIAGNOSIS — E785 Hyperlipidemia, unspecified: Secondary | ICD-10-CM | POA: Diagnosis not present

## 2018-12-21 DIAGNOSIS — D044 Carcinoma in situ of skin of scalp and neck: Secondary | ICD-10-CM | POA: Diagnosis not present

## 2018-12-22 DIAGNOSIS — E785 Hyperlipidemia, unspecified: Secondary | ICD-10-CM | POA: Diagnosis not present

## 2018-12-22 DIAGNOSIS — D7589 Other specified diseases of blood and blood-forming organs: Secondary | ICD-10-CM | POA: Diagnosis not present

## 2018-12-22 DIAGNOSIS — Z5181 Encounter for therapeutic drug level monitoring: Secondary | ICD-10-CM | POA: Diagnosis not present

## 2018-12-22 DIAGNOSIS — I10 Essential (primary) hypertension: Secondary | ICD-10-CM | POA: Diagnosis not present

## 2018-12-22 DIAGNOSIS — C50911 Malignant neoplasm of unspecified site of right female breast: Secondary | ICD-10-CM | POA: Diagnosis not present

## 2018-12-23 LAB — COMPREHENSIVE METABOLIC PANEL
Albumin: 4.1 (ref 3.5–5.0)
Calcium: 10.2 (ref 8.7–10.7)
GFR calc Af Amer: 65
GFR calc non Af Amer: 57
Globulin: 2.2

## 2018-12-23 LAB — BASIC METABOLIC PANEL
BUN: 25 — AB (ref 4–21)
CO2: 22 (ref 13–22)
Chloride: 101 (ref 99–108)
Creatinine: 1 (ref 0.5–1.1)
Glucose: 97
Potassium: 4.4 (ref 3.4–5.3)
Sodium: 139 (ref 137–147)

## 2018-12-23 LAB — HEPATIC FUNCTION PANEL
ALT: 18 (ref 7–35)
AST: 15 (ref 13–35)
Alkaline Phosphatase: 95 (ref 25–125)
Bilirubin, Total: 0.5

## 2019-01-19 DIAGNOSIS — C50911 Malignant neoplasm of unspecified site of right female breast: Secondary | ICD-10-CM | POA: Diagnosis not present

## 2019-01-19 DIAGNOSIS — R928 Other abnormal and inconclusive findings on diagnostic imaging of breast: Secondary | ICD-10-CM | POA: Diagnosis not present

## 2019-02-25 ENCOUNTER — Other Ambulatory Visit: Payer: Self-pay | Admitting: Family Medicine

## 2019-03-23 DIAGNOSIS — Z23 Encounter for immunization: Secondary | ICD-10-CM | POA: Diagnosis not present

## 2019-03-31 ENCOUNTER — Encounter: Payer: Self-pay | Admitting: Family Medicine

## 2019-03-31 ENCOUNTER — Ambulatory Visit (INDEPENDENT_AMBULATORY_CARE_PROVIDER_SITE_OTHER): Payer: Medicare Other | Admitting: Family Medicine

## 2019-03-31 ENCOUNTER — Other Ambulatory Visit: Payer: Self-pay

## 2019-03-31 VITALS — BP 120/67 | HR 81 | Ht 64.0 in | Wt 158.0 lb

## 2019-03-31 DIAGNOSIS — F439 Reaction to severe stress, unspecified: Secondary | ICD-10-CM

## 2019-03-31 DIAGNOSIS — Z170 Estrogen receptor positive status [ER+]: Secondary | ICD-10-CM | POA: Diagnosis not present

## 2019-03-31 DIAGNOSIS — C50911 Malignant neoplasm of unspecified site of right female breast: Secondary | ICD-10-CM

## 2019-03-31 DIAGNOSIS — I10 Essential (primary) hypertension: Secondary | ICD-10-CM

## 2019-03-31 DIAGNOSIS — N1831 Chronic kidney disease, stage 3a: Secondary | ICD-10-CM | POA: Diagnosis not present

## 2019-03-31 NOTE — Assessment & Plan Note (Addendum)
Well controlled. Continue current regimen. Follow up in  6 mo did review care everywhere.  She did have a chemistry panel done in December and will get that updated in our records that she will make sure that she had an up-to-date renal function.  Lipids are up-to-date from last summer.

## 2019-03-31 NOTE — Progress Notes (Signed)
Established Patient Office Visit  Subjective:  Patient ID: Kendra [name], female    DOB: 24-Jun-1940  Age: 79 y.o. MRN: [id number]  CC:  Chief Complaint  Patient presents with  . Hypertension    HPI [name] presents for   Hypertension- Pt denies chest pain, SOB, dizziness, or heart palpitations.  Taking meds as directed w/o problems.  Denies medication side effects.    She still sees Dr. Georgiann Cocker her oncologist every 3 months she reports that she did get some blood work done a few months ago per care everywhere it was performed in December.  She did have a slight bump in her creatinine.  They had her hydrate and then come back and recheck it and it came back down to normal.  She says she is been trying to drink more water since then.  She reports that she did have some updated blood work in December.  Husband has had a lot of of health problems in the last year.  This has been stressful for her and him.  He was in and out of the hospital.  Turns out that he actually was having A. fib he was getting the spells of weakness.  He has had a pacemaker placed since then and is actually doing better.  Per our records she is also due for screening colonoscopy.  Past Medical History:  Diagnosis Date  . High cholesterol   . Hypertension     Past Surgical History:  Procedure Laterality Date  . ABDOMINAL HYSTERECTOMY    . KNEE SURGERY      No family history on file.  Social History   Socioeconomic History  . Marital status: Married    Spouse name: Not on file  . Number of children: Not on file  . Years of education: Not on file  . Highest education level: Not on file  Occupational History  . Not on file  Tobacco Use  . Smoking status: Former Research scientist (life sciences)  . Smokeless tobacco: Never Used  . Tobacco comment: quit 30 years ago  Substance and Sexual Activity  . Alcohol use: Yes  . Drug use: No  . Sexual activity: Not on file  Other Topics Concern  . Not on file  Social History  Narrative  . Not on file   Social Determinants of Health   Financial Resource Strain:   . Difficulty of Paying Living Expenses:   Food Insecurity:   . Worried About Charity fundraiser in the Last Year:   . Arboriculturist in the Last Year:   Transportation Needs:   . Film/video editor (Medical):   Marland Kitchen Lack of Transportation (Non-Medical):   Physical Activity:   . Days of Exercise per Week:   . Minutes of Exercise per Session:   Stress:   . Feeling of Stress :   Social Connections:   . Frequency of Communication with Friends and Family:   . Frequency of Social Gatherings with Friends and Family:   . Attends Religious Services:   . Active Member of Clubs or Organizations:   . Attends Archivist Meetings:   Marland Kitchen Marital Status:   Intimate Partner Violence:   . Fear of Current or Ex-Partner:   . Emotionally Abused:   Marland Kitchen Physically Abused:   . Sexually Abused:     Outpatient Medications Prior to Visit  Medication Sig Dispense Refill  . AMBULATORY NON FORMULARY MEDICATION by Other route as needed. Medication Name: AsperCream  Apply to affected joints as needed    . aspirin 81 MG tablet Take 81 mg by mouth daily.    Marland Kitchen atorvastatin (LIPITOR) 40 MG tablet TAKE 1 TABLET BY MOUTH DAILY AT 6 PM 90 tablet 3  . Calcium Carb-Cholecalciferol (CALCIUM 600+D3 PO) Take 1 tablet by mouth daily.    . Calcium Carbonate-Vitamin D 600-200 MG-UNIT TABS Take 1 tablet by mouth daily.    . diclofenac sodium (VOLTAREN) 1 % GEL APPLY 4 GRAMS TO AFFECTED JOINT FOUR TIMES DAILY (Patient taking differently: as needed. ) 100 g 0  . Famotidine (PEPCID PO) Take by mouth.    . FIBER SELECT GUMMIES PO Take by mouth.    . fluticasone (FLONASE) 50 MCG/ACT nasal spray SHAKE LIQUID AND USE 1 SPRAY IN EACH NOSTRIL EVERY DAY 48 g 3  . hydrochlorothiazide (HYDRODIURIL) 25 MG tablet TAKE 1 TABLET BY MOUTH DAILY 90 tablet 1  . Icosapent Ethyl (VASCEPA) 1 g CAPS Take 2 capsules (2 g total) by mouth 2 (two)  times daily. 120 capsule 11  . letrozole (FEMARA) 2.5 MG tablet Take 2.5 mg by mouth daily.  10  . losartan (COZAAR) 100 MG tablet TAKE 1 TABLET BY MOUTH DAILY 90 tablet 1  . Probiotic Product (PROBIOTIC DAILY PO) Take 1 tablet by mouth daily.     No facility-administered medications prior to visit.    Allergies  Allergen Reactions  . Morphine And Related Rash  . Morphine     ROS Review of Systems    Objective:    Physical Exam  BP 120/67   Pulse 81   Ht '5\' 4"'  (1.626 m)   Wt 158 lb (71.7 kg)   PF 100 L/min   BMI 27.12 kg/m  Wt Readings from Last 3 Encounters:  03/31/19 158 lb (71.7 kg)  05/27/18 156 lb 6.4 oz (70.9 kg)  11/13/17 161 lb (73 kg)     Health Maintenance Due  Topic Date Due  . COLONOSCOPY  09/05/2018    There are no preventive care reminders to display for this patient.  Lab Results  Component Value Date   TSH 1.10 11/13/2016   Lab Results  Component Value Date   WBC 4.8 08/12/2011   HGB 13.1 08/12/2011   HCT 38.6 08/12/2011   MCV 95.1 08/12/2011   PLT 220 08/12/2011   Lab Results  Component Value Date   NA 136 06/25/2018   K 4.1 06/25/2018   CO2 24 06/25/2018   GLUCOSE 98 06/25/2018   BUN 27 (H) 06/25/2018   CREATININE 1.15 (H) 06/25/2018   BILITOT 0.6 11/13/2016   ALKPHOS 59 11/15/2015   AST 15 11/13/2016   ALT 13 11/13/2016   PROT 6.5 11/13/2016   ALBUMIN 4.1 11/15/2015   CALCIUM 10.3 06/25/2018   Lab Results  Component Value Date   CHOL 148 06/25/2018   Lab Results  Component Value Date   HDL 53 06/25/2018   Lab Results  Component Value Date   LDLCALC 79 06/25/2018   Lab Results  Component Value Date   TRIG 81 06/25/2018   Lab Results  Component Value Date   CHOLHDL 2.8 06/25/2018   Lab Results  Component Value Date   HGBA1C 5.1 05/14/2016      Assessment & Plan:   Problem List Items Addressed This Visit      Cardiovascular and Mediastinum   Essential hypertension, benign - Primary    Well  controlled. Continue current regimen. Follow up in  6 mo  did review care everywhere.  She did have a chemistry panel done in December and will get that updated in our records that she will make sure that she had an up-to-date renal function.  Lipids are up-to-date from last summer.        Genitourinary   CKD (chronic kidney disease) stage 3, GFR 30-59 ml/min    Just want to make sure that we are following this every 6 months again she just had a chemistry done in December        Other   Malignant neoplasm of right breast in female, estrogen receptor positive ([place])    Follows with Dr. Georgiann Cocker every 3 months.  Currently on Femara.  Otherwise doing well.       Other Visit Diagnoses    Stress at home          Stress at home-unfortunately her husband has had multiple major medical issues over the last year that is been quite stressful for the family.  Hopefully he will continue to improve and recover and now has a pacemaker which has helped.  Just encouraged her to reach out if at any point she feels like she needs any help or assistance.  No orders of the defined types were placed in this encounter.   Follow-up: Return in about 6 months (around 10/01/2019) for Hypertension.   Care Everywhere Result Report Comprehensive Metabolic PanelResulted: 53/64/6803 9:36 AM [facility] Component Name Value Ref Range  Glucose 97 65 - 99 mg/dL  BUN 25 8 - 27 mg/dL  Creatinine, Serum 0.98 0.57 - 1 mg/dL  eGFR If NonAfrican American 57 (L) >59 mL/min/1.73  eGFR If African American 65 >59 mL/min/1.73  BUN/Creatinine Ratio 26 12 - 28   Sodium 139 134 - 144 mmol/L  Potassium 4.4 3.5 - 5.2 mmol/L  Chloride 101 96 - 106 mmol/L  CO2 22 20 - 29 mmol/L  CALCIUM 10.2 8.7 - 10.3 mg/dL  Total Protein 6.3 6 - 8.5 g/dL  Albumin, Serum 4.1 3.7 - 4.7 g/dL  Globulin, Total 2.2 1.5 - 4.5 g/dL  Albumin/Globulin Ratio 1.9 1.2 - 2.2   Total Bilirubin 0.5 0 - 1.2 mg/dL  Alkaline Phosphatase 95 39 - 117 IU/L   AST 15 0 - 40 IU/L  ALT (SGPT) 18 0 - 32 IU/L   Time spent in encounter including reviewing medical records and labs, 30 minutes  Beatrice Lecher, MD

## 2019-03-31 NOTE — Progress Notes (Signed)
She would like a prescription for Prilosec. She stated that this has helped her and believes that it will be cheaper if she has the prescription instead of buying this OTC.Elouise Munroe, [place]

## 2019-04-01 ENCOUNTER — Encounter: Payer: Self-pay | Admitting: Family Medicine

## 2019-04-01 NOTE — Assessment & Plan Note (Signed)
Just want to make sure that we are following this every 6 months again she just had a chemistry done in December

## 2019-04-01 NOTE — Assessment & Plan Note (Signed)
Follows with Dr. Georgiann Cocker every 3 months.  Currently on Femara.  Otherwise doing well.

## 2019-04-05 ENCOUNTER — Encounter: Payer: Self-pay | Admitting: Family Medicine

## 2019-04-13 DIAGNOSIS — Z23 Encounter for immunization: Secondary | ICD-10-CM | POA: Diagnosis not present

## 2019-04-20 DIAGNOSIS — Z79811 Long term (current) use of aromatase inhibitors: Secondary | ICD-10-CM | POA: Diagnosis not present

## 2019-04-20 DIAGNOSIS — Z86711 Personal history of pulmonary embolism: Secondary | ICD-10-CM | POA: Diagnosis not present

## 2019-04-20 DIAGNOSIS — I10 Essential (primary) hypertension: Secondary | ICD-10-CM | POA: Diagnosis not present

## 2019-04-20 DIAGNOSIS — C50911 Malignant neoplasm of unspecified site of right female breast: Secondary | ICD-10-CM | POA: Diagnosis not present

## 2019-04-20 DIAGNOSIS — Z7982 Long term (current) use of aspirin: Secondary | ICD-10-CM | POA: Diagnosis not present

## 2019-04-20 DIAGNOSIS — Z5181 Encounter for therapeutic drug level monitoring: Secondary | ICD-10-CM | POA: Diagnosis not present

## 2019-04-20 DIAGNOSIS — K219 Gastro-esophageal reflux disease without esophagitis: Secondary | ICD-10-CM | POA: Diagnosis not present

## 2019-04-20 DIAGNOSIS — Z79899 Other long term (current) drug therapy: Secondary | ICD-10-CM | POA: Diagnosis not present

## 2019-04-20 DIAGNOSIS — Z170 Estrogen receptor positive status [ER+]: Secondary | ICD-10-CM | POA: Diagnosis not present

## 2019-04-20 DIAGNOSIS — Z1379 Encounter for other screening for genetic and chromosomal anomalies: Secondary | ICD-10-CM | POA: Diagnosis not present

## 2019-04-21 DIAGNOSIS — D485 Neoplasm of uncertain behavior of skin: Secondary | ICD-10-CM | POA: Diagnosis not present

## 2019-05-31 DIAGNOSIS — Z86010 Personal history of colonic polyps: Secondary | ICD-10-CM | POA: Diagnosis not present

## 2019-05-31 DIAGNOSIS — D125 Benign neoplasm of sigmoid colon: Secondary | ICD-10-CM | POA: Diagnosis not present

## 2019-05-31 DIAGNOSIS — K635 Polyp of colon: Secondary | ICD-10-CM | POA: Diagnosis not present

## 2019-05-31 DIAGNOSIS — K644 Residual hemorrhoidal skin tags: Secondary | ICD-10-CM | POA: Diagnosis not present

## 2019-05-31 LAB — HM COLONOSCOPY

## 2019-06-05 ENCOUNTER — Other Ambulatory Visit: Payer: Self-pay | Admitting: Family Medicine

## 2019-06-10 ENCOUNTER — Encounter: Payer: Self-pay | Admitting: Family Medicine

## 2019-08-09 DIAGNOSIS — C50911 Malignant neoplasm of unspecified site of right female breast: Secondary | ICD-10-CM | POA: Diagnosis not present

## 2019-08-09 DIAGNOSIS — R928 Other abnormal and inconclusive findings on diagnostic imaging of breast: Secondary | ICD-10-CM | POA: Diagnosis not present

## 2019-08-09 DIAGNOSIS — Z170 Estrogen receptor positive status [ER+]: Secondary | ICD-10-CM | POA: Diagnosis not present

## 2019-08-30 ENCOUNTER — Other Ambulatory Visit: Payer: Self-pay | Admitting: Family Medicine

## 2019-09-05 DIAGNOSIS — M85852 Other specified disorders of bone density and structure, left thigh: Secondary | ICD-10-CM | POA: Diagnosis not present

## 2019-09-05 DIAGNOSIS — C50911 Malignant neoplasm of unspecified site of right female breast: Secondary | ICD-10-CM | POA: Diagnosis not present

## 2019-09-05 DIAGNOSIS — Z780 Asymptomatic menopausal state: Secondary | ICD-10-CM | POA: Diagnosis not present

## 2019-09-20 DIAGNOSIS — H25813 Combined forms of age-related cataract, bilateral: Secondary | ICD-10-CM | POA: Diagnosis not present

## 2019-09-20 DIAGNOSIS — H5203 Hypermetropia, bilateral: Secondary | ICD-10-CM | POA: Diagnosis not present

## 2019-09-20 DIAGNOSIS — H52223 Regular astigmatism, bilateral: Secondary | ICD-10-CM | POA: Diagnosis not present

## 2019-09-20 DIAGNOSIS — H524 Presbyopia: Secondary | ICD-10-CM | POA: Diagnosis not present

## 2019-09-27 DIAGNOSIS — Z79811 Long term (current) use of aromatase inhibitors: Secondary | ICD-10-CM | POA: Diagnosis not present

## 2019-09-27 DIAGNOSIS — Z170 Estrogen receptor positive status [ER+]: Secondary | ICD-10-CM | POA: Diagnosis not present

## 2019-09-27 DIAGNOSIS — Z5181 Encounter for therapeutic drug level monitoring: Secondary | ICD-10-CM | POA: Diagnosis not present

## 2019-09-27 DIAGNOSIS — Z86711 Personal history of pulmonary embolism: Secondary | ICD-10-CM | POA: Diagnosis not present

## 2019-09-27 DIAGNOSIS — I10 Essential (primary) hypertension: Secondary | ICD-10-CM | POA: Diagnosis not present

## 2019-09-27 DIAGNOSIS — Z1231 Encounter for screening mammogram for malignant neoplasm of breast: Secondary | ICD-10-CM | POA: Diagnosis not present

## 2019-09-27 DIAGNOSIS — M85852 Other specified disorders of bone density and structure, left thigh: Secondary | ICD-10-CM | POA: Diagnosis not present

## 2019-09-27 DIAGNOSIS — C50911 Malignant neoplasm of unspecified site of right female breast: Secondary | ICD-10-CM | POA: Diagnosis not present

## 2019-09-30 ENCOUNTER — Ambulatory Visit: Payer: Medicare Other | Admitting: Family Medicine

## 2019-10-19 DIAGNOSIS — Z23 Encounter for immunization: Secondary | ICD-10-CM | POA: Diagnosis not present

## 2019-11-16 ENCOUNTER — Other Ambulatory Visit: Payer: Self-pay | Admitting: Family Medicine

## 2019-12-09 DIAGNOSIS — Z23 Encounter for immunization: Secondary | ICD-10-CM | POA: Diagnosis not present

## 2020-02-28 ENCOUNTER — Other Ambulatory Visit: Payer: Self-pay | Admitting: Family Medicine

## 2020-03-28 DIAGNOSIS — D7589 Other specified diseases of blood and blood-forming organs: Secondary | ICD-10-CM | POA: Diagnosis not present

## 2020-03-28 DIAGNOSIS — M85852 Other specified disorders of bone density and structure, left thigh: Secondary | ICD-10-CM | POA: Diagnosis not present

## 2020-03-28 DIAGNOSIS — Z87898 Personal history of other specified conditions: Secondary | ICD-10-CM | POA: Diagnosis not present

## 2020-03-28 DIAGNOSIS — Z79811 Long term (current) use of aromatase inhibitors: Secondary | ICD-10-CM | POA: Diagnosis not present

## 2020-03-28 DIAGNOSIS — Z1379 Encounter for other screening for genetic and chromosomal anomalies: Secondary | ICD-10-CM | POA: Diagnosis not present

## 2020-03-28 DIAGNOSIS — C50911 Malignant neoplasm of unspecified site of right female breast: Secondary | ICD-10-CM | POA: Diagnosis not present

## 2020-03-28 DIAGNOSIS — I10 Essential (primary) hypertension: Secondary | ICD-10-CM | POA: Diagnosis not present

## 2020-03-28 DIAGNOSIS — Z79899 Other long term (current) drug therapy: Secondary | ICD-10-CM | POA: Diagnosis not present

## 2020-03-28 DIAGNOSIS — Z86711 Personal history of pulmonary embolism: Secondary | ICD-10-CM | POA: Diagnosis not present

## 2020-03-28 DIAGNOSIS — K219 Gastro-esophageal reflux disease without esophagitis: Secondary | ICD-10-CM | POA: Diagnosis not present

## 2020-03-28 DIAGNOSIS — Z170 Estrogen receptor positive status [ER+]: Secondary | ICD-10-CM | POA: Diagnosis not present

## 2020-03-28 DIAGNOSIS — Z5181 Encounter for therapeutic drug level monitoring: Secondary | ICD-10-CM | POA: Diagnosis not present

## 2020-03-28 DIAGNOSIS — Z7982 Long term (current) use of aspirin: Secondary | ICD-10-CM | POA: Diagnosis not present

## 2020-03-28 DIAGNOSIS — Z9189 Other specified personal risk factors, not elsewhere classified: Secondary | ICD-10-CM | POA: Diagnosis not present

## 2020-03-28 DIAGNOSIS — Z86010 Personal history of colonic polyps: Secondary | ICD-10-CM | POA: Diagnosis not present

## 2020-04-06 ENCOUNTER — Encounter: Payer: Self-pay | Admitting: Family Medicine

## 2020-05-08 ENCOUNTER — Other Ambulatory Visit: Payer: Self-pay | Admitting: Family Medicine

## 2020-05-27 ENCOUNTER — Other Ambulatory Visit: Payer: Self-pay | Admitting: Family Medicine

## 2020-06-04 ENCOUNTER — Encounter: Payer: Self-pay | Admitting: Family Medicine

## 2020-06-04 DIAGNOSIS — E785 Hyperlipidemia, unspecified: Secondary | ICD-10-CM

## 2020-07-08 DIAGNOSIS — B029 Zoster without complications: Secondary | ICD-10-CM | POA: Diagnosis not present

## 2020-08-29 ENCOUNTER — Other Ambulatory Visit: Payer: Self-pay | Admitting: *Deleted

## 2020-08-29 DIAGNOSIS — E041 Nontoxic single thyroid nodule: Secondary | ICD-10-CM

## 2020-08-29 DIAGNOSIS — E785 Hyperlipidemia, unspecified: Secondary | ICD-10-CM

## 2020-08-29 DIAGNOSIS — N1831 Chronic kidney disease, stage 3a: Secondary | ICD-10-CM

## 2020-08-29 DIAGNOSIS — I10 Essential (primary) hypertension: Secondary | ICD-10-CM

## 2020-09-04 DIAGNOSIS — N1831 Chronic kidney disease, stage 3a: Secondary | ICD-10-CM | POA: Diagnosis not present

## 2020-09-04 DIAGNOSIS — E785 Hyperlipidemia, unspecified: Secondary | ICD-10-CM | POA: Diagnosis not present

## 2020-09-04 DIAGNOSIS — I10 Essential (primary) hypertension: Secondary | ICD-10-CM | POA: Diagnosis not present

## 2020-09-04 DIAGNOSIS — E041 Nontoxic single thyroid nodule: Secondary | ICD-10-CM | POA: Diagnosis not present

## 2020-09-05 LAB — COMPLETE METABOLIC PANEL WITH GFR
AG Ratio: 1.9 (calc) (ref 1.0–2.5)
ALT: 18 U/L (ref 6–29)
AST: 18 U/L (ref 10–35)
Albumin: 4.3 g/dL (ref 3.6–5.1)
Alkaline phosphatase (APISO): 53 U/L (ref 37–153)
BUN: 21 mg/dL (ref 7–25)
CO2: 26 mmol/L (ref 20–32)
Calcium: 10 mg/dL (ref 8.6–10.4)
Chloride: 100 mmol/L (ref 98–110)
Creat: 0.86 mg/dL (ref 0.60–1.00)
Globulin: 2.3 g/dL (calc) (ref 1.9–3.7)
Glucose, Bld: 96 mg/dL (ref 65–139)
Potassium: 4.3 mmol/L (ref 3.5–5.3)
Sodium: 136 mmol/L (ref 135–146)
Total Bilirubin: 0.6 mg/dL (ref 0.2–1.2)
Total Protein: 6.6 g/dL (ref 6.1–8.1)
eGFR: 70 mL/min/{1.73_m2} (ref >=60)

## 2020-09-05 LAB — LIPID PANEL
Cholesterol: 165 mg/dL (ref <200)
HDL: 48 mg/dL — ABNORMAL LOW (ref >=50)
LDL Cholesterol (Calc): 96 mg/dL (calc)
Non-HDL Cholesterol (Calc): 117 mg/dL (calc) (ref <130)
Total CHOL/HDL Ratio: 3.4 (calc) (ref <5.0)
Triglycerides: 109 mg/dL (ref <150)

## 2020-09-05 LAB — TSH: TSH: 1.38 mIU/L (ref 0.40–4.50)

## 2020-09-05 NOTE — Progress Notes (Signed)
Your lab work is within acceptable range and there are no concerning findings.   ?

## 2020-09-06 DIAGNOSIS — E785 Hyperlipidemia, unspecified: Secondary | ICD-10-CM | POA: Diagnosis not present

## 2020-09-06 DIAGNOSIS — E041 Nontoxic single thyroid nodule: Secondary | ICD-10-CM | POA: Diagnosis not present

## 2020-09-06 DIAGNOSIS — I10 Essential (primary) hypertension: Secondary | ICD-10-CM | POA: Diagnosis not present

## 2020-09-06 DIAGNOSIS — N1831 Chronic kidney disease, stage 3a: Secondary | ICD-10-CM | POA: Diagnosis not present

## 2020-09-07 LAB — MICROALBUMIN / CREATININE URINE RATIO
Creatinine, Urine: 29 mg/dL (ref 20–275)
Microalb, Ur: <0.2 mg/dL

## 2020-09-07 NOTE — Progress Notes (Signed)
Your lab work is within acceptable range and there are no concerning findings.   ?

## 2020-09-18 ENCOUNTER — Other Ambulatory Visit: Payer: Self-pay

## 2020-09-18 ENCOUNTER — Ambulatory Visit (INDEPENDENT_AMBULATORY_CARE_PROVIDER_SITE_OTHER): Payer: Medicare Other | Admitting: Family Medicine

## 2020-09-18 VITALS — BP 111/64 | HR 71 | Wt 153.0 lb

## 2020-09-18 DIAGNOSIS — K219 Gastro-esophageal reflux disease without esophagitis: Secondary | ICD-10-CM

## 2020-09-18 DIAGNOSIS — N1831 Chronic kidney disease, stage 3a: Secondary | ICD-10-CM

## 2020-09-18 DIAGNOSIS — Z23 Encounter for immunization: Secondary | ICD-10-CM | POA: Diagnosis not present

## 2020-09-18 DIAGNOSIS — I10 Essential (primary) hypertension: Secondary | ICD-10-CM

## 2020-09-18 MED ORDER — OMEPRAZOLE 20 MG PO CPDR: 20.0000 mg | DELAYED_RELEASE_CAPSULE | Freq: Every day | ORAL | 0 refills | Status: DC

## 2020-09-18 NOTE — Assessment & Plan Note (Signed)
Sent rx for Prilosec. She uses it daily

## 2020-09-18 NOTE — Assessment & Plan Note (Addendum)
BP was low today so will have her cut her losartan in half.  Monitor at home. F/U in 4 wk nurse visit.

## 2020-09-18 NOTE — Patient Instructions (Signed)
Please cut your losartan in half. Take a half a tab a day. OK to restart a whole tab if your Blood pressures are going above 140.  Once you have been off the Vascepa for a month we can recheck your Triglycerides.

## 2020-09-18 NOTE — Progress Notes (Signed)
Established Patient Office Visit  Subjective:  Patient ID: Kendra [name], female    DOB: 07-26-1940  Age: 80 y.o. MRN: [id number]  CC:  Chief Complaint  Patient presents with   Hypertension    HPI Kendra [name] presents for   Hypertension- Pt denies chest pain, SOB, dizziness, or heart palpitations.  Taking meds as directed w/o problems.  Denies medication side effects.   She just had labs done in August which were normal.  F/U CKD 3 - no recent changes.   Follow-up hyperlipidemia-she was previously on a statin and Vascepa.  She had reached out because her insurance was no longer wanting to cover the Vascepa and they recommended that she try a statin.  But she was already taking a statin.  So had encouraged her to get the blood work off of the [facility] with just the statin to see if we need to continue to [place] or not.  Past Medical History:  Diagnosis Date   High cholesterol    Hypertension     Past Surgical History:  Procedure Laterality Date   ABDOMINAL HYSTERECTOMY     KNEE SURGERY      No family history on file.  Social History   Socioeconomic History   Marital status: Married    Spouse name: Not on file   Number of children: Not on file   Years of education: Not on file   Highest education level: Not on file  Occupational History   Not on file  Tobacco Use   Smoking status: Former   Smokeless tobacco: Never   Tobacco comments:    quit 30 years ago  Substance and Sexual Activity   Alcohol use: Yes   Drug use: No   Sexual activity: Not on file  Other Topics Concern   Not on file  Social History Narrative   Not on file   Social Determinants of Health   Financial Resource Strain: Not on file  Food Insecurity: Not on file  Transportation Needs: Not on file  Physical Activity: Not on file  Stress: Not on file  Social Connections: Not on file  Intimate Partner Violence: Not on file    Outpatient Medications Prior to Visit  Medication Sig  Dispense Refill   AMBULATORY NON FORMULARY MEDICATION by Other route as needed. Medication Name: AsperCream Apply to affected joints as needed     aspirin 81 MG tablet Take 81 mg by mouth daily.     atorvastatin (LIPITOR) 40 MG tablet TAKE 1 TABLET BY MOUTH DAILY AT 6 PM 90 tablet 3   Calcium Carb-Cholecalciferol (CALCIUM 600+D3 PO) Take 1 tablet by mouth daily.     Calcium Carbonate-Vitamin D 600-200 MG-UNIT TABS Take 1 tablet by mouth daily.     diclofenac sodium (VOLTAREN) 1 % GEL APPLY 4 GRAMS TO AFFECTED JOINT FOUR TIMES DAILY (Patient taking differently: as needed.) 100 g 0   docusate sodium (COLACE) 100 MG capsule Take 100 mg by mouth 2 (two) times daily.     FIBER SELECT GUMMIES PO Take by mouth.     fluticasone (FLONASE) 50 MCG/ACT nasal spray SHAKE LIQUID AND USE 1 SPRAY IN EACH NOSTRIL EVERY DAY 48 g 3   hydrochlorothiazide (HYDRODIURIL) 25 MG tablet TAKE 1 TABLET BY MOUTH DAILY 90 tablet 1   icosapent Ethyl (VASCEPA) 1 g capsule TAKE 2 CAPSULES( 2 GM TOTAL) BY MOUTH 2 TIMES DAILY 120 capsule 11   letrozole (FEMARA) 2.5 MG tablet Take 2.5 mg by  mouth daily.  10   losartan (COZAAR) 100 MG tablet TAKE 1 TABLET BY MOUTH DAILY 90 tablet 1   polyethylene glycol (MIRALAX / GLYCOLAX) 17 g packet Take 17 g by mouth daily.     omeprazole (PRILOSEC) 40 MG capsule Take 40 mg by mouth daily.     Famotidine (PEPCID PO) Take by mouth.     Probiotic Product (PROBIOTIC DAILY PO) Take 1 tablet by mouth daily.     No facility-administered medications prior to visit.    Allergies  Allergen Reactions   Morphine And Related Rash   Morphine     ROS Review of Systems    Objective:    Physical Exam Constitutional:      Appearance: Normal appearance. She is well-developed.  HENT:     Head: Normocephalic and atraumatic.  Cardiovascular:     Rate and Rhythm: Normal rate and regular rhythm.     Heart sounds: Normal heart sounds.  Pulmonary:     Effort: Pulmonary effort is normal.      Breath sounds: Normal breath sounds.  Skin:    General: Skin is warm and dry.  Neurological:     Mental Status: She is alert and oriented to person, place, and time.  Psychiatric:        Behavior: Behavior normal.    BP 111/64   Pulse 71   Wt 153 lb (69.4 kg)   SpO2 99%   BMI 26.26 kg/m  Wt Readings from Last 3 Encounters:  09/18/20 153 lb (69.4 kg)  03/31/19 158 lb (71.7 kg)  05/27/18 156 lb 6.4 oz (70.9 kg)     Health Maintenance Due  Topic Date Due   Hepatitis C Screening  Never done   Zoster Vaccines- Shingrix (1 of 2) Never done   COVID-19 Vaccine (3 - Pfizer risk series) 05/11/2019   TETANUS/TDAP  01/14/2020   INFLUENZA VACCINE  08/13/2020    There are no preventive care reminders to display for this patient.  Lab Results  Component Value Date   TSH 1.38 09/04/2020   Lab Results  Component Value Date   WBC 4.8 08/12/2011   HGB 13.1 08/12/2011   HCT 38.6 08/12/2011   MCV 95.1 08/12/2011   PLT 220 08/12/2011   Lab Results  Component Value Date   NA 136 09/04/2020   K 4.3 09/04/2020   CO2 26 09/04/2020   GLUCOSE 96 09/04/2020   BUN 21 09/04/2020   CREATININE 0.86 09/04/2020   BILITOT 0.6 09/04/2020   ALKPHOS 95 12/23/2018   AST 18 09/04/2020   ALT 18 09/04/2020   PROT 6.6 09/04/2020   ALBUMIN 4.1 12/23/2018   CALCIUM 10.0 09/04/2020   EGFR 70 09/04/2020   Lab Results  Component Value Date   CHOL 165 09/04/2020   Lab Results  Component Value Date   HDL 48 (L) 09/04/2020   Lab Results  Component Value Date   LDLCALC 96 09/04/2020   Lab Results  Component Value Date   TRIG 109 09/04/2020   Lab Results  Component Value Date   CHOLHDL 3.4 09/04/2020   Lab Results  Component Value Date   HGBA1C 5.1 05/14/2016      Assessment & Plan:   Problem List Items Addressed This Visit       Cardiovascular and Mediastinum   Essential hypertension, benign - Primary    BP was low today so will have her cut her losartan in half.  Monitor at  home. F/U in 4  wk nurse visit.         Digestive   GERD (gastroesophageal reflux disease)    Sent rx for Prilosec. She uses it daily       Relevant Medications   docusate sodium (COLACE) 100 MG capsule   polyethylene glycol (MIRALAX / GLYCOLAX) 17 g packet   omeprazole (PRILOSEC) 20 MG capsule     Genitourinary   CKD (chronic kidney disease) stage 3, GFR 30-59 ml/min (HCC)    Monitor Q 6 mo       Other Visit Diagnoses     Needs flu shot       Relevant Orders   Flu Vaccine QUAD High Dose(Fluad)       Meds ordered this encounter  Medications   omeprazole (PRILOSEC) 20 MG capsule    Sig: Take 1 capsule (20 mg total) by mouth daily.    Dispense:  90 capsule    Refill:  0     Follow-up: Return in about 4 weeks (around 10/16/2020) for Nurse visit for BP check .    Beatrice Lecher, MD

## 2020-09-18 NOTE — Assessment & Plan Note (Signed)
Monitor Q 6 mo

## 2020-09-28 DIAGNOSIS — Z1231 Encounter for screening mammogram for malignant neoplasm of breast: Secondary | ICD-10-CM | POA: Diagnosis not present

## 2020-10-01 DIAGNOSIS — C50911 Malignant neoplasm of unspecified site of right female breast: Secondary | ICD-10-CM | POA: Diagnosis not present

## 2020-10-01 DIAGNOSIS — I10 Essential (primary) hypertension: Secondary | ICD-10-CM | POA: Diagnosis not present

## 2020-10-01 DIAGNOSIS — M85852 Other specified disorders of bone density and structure, left thigh: Secondary | ICD-10-CM | POA: Diagnosis not present

## 2020-10-01 DIAGNOSIS — Z170 Estrogen receptor positive status [ER+]: Secondary | ICD-10-CM | POA: Diagnosis not present

## 2020-10-16 ENCOUNTER — Ambulatory Visit (INDEPENDENT_AMBULATORY_CARE_PROVIDER_SITE_OTHER): Payer: Medicare Other | Admitting: Family Medicine

## 2020-10-16 ENCOUNTER — Encounter: Payer: Self-pay | Admitting: Family Medicine

## 2020-10-16 ENCOUNTER — Other Ambulatory Visit: Payer: Self-pay | Admitting: *Deleted

## 2020-10-16 VITALS — BP 106/60 | HR 70 | Temp 98.0°F

## 2020-10-16 DIAGNOSIS — I10 Essential (primary) hypertension: Secondary | ICD-10-CM

## 2020-10-16 DIAGNOSIS — Z23 Encounter for immunization: Secondary | ICD-10-CM | POA: Diagnosis not present

## 2020-10-16 MED ORDER — LOSARTAN POTASSIUM 25 MG PO TABS: 25.0000 mg | ORAL_TABLET | Freq: Every day | ORAL | 0 refills | Status: DC

## 2020-10-16 MED ORDER — LOSARTAN POTASSIUM 25 MG PO TABS: 25.0000 mg | ORAL_TABLET | Freq: Every day | ORAL | 1 refills | Status: DC

## 2020-10-16 NOTE — Patient Instructions (Signed)
Decrease losartan to 25 mg daily A prescription for this has been sent to your pharmacy Return in 3 weeks for a nurse visit to recheck your blood pressure Keep a log of blood pressure checks at home to bring with you to the nurse visit  Tdap immunization given today Tdap (Tetanus, Diphtheria, Pertussis) Vaccine: What You Need to Know 1. Why get vaccinated? Tdap vaccine can prevent tetanus, diphtheria, and pertussis. Diphtheria and pertussis spread from person to person. Tetanus enters the body through cuts or wounds. TETANUS (T) causes painful stiffening of the muscles. Tetanus can lead to serious health problems, including being unable to open the mouth, having trouble swallowing and breathing, or death. DIPHTHERIA (D) can lead to difficulty breathing, heart failure, paralysis, or death. PERTUSSIS (aP), also known as "whooping cough," can cause uncontrollable, violent coughing that makes it hard to breathe, eat, or drink. Pertussis can be extremely serious especially in babies and young children, causing pneumonia, convulsions, brain damage, or death. In teens and adults, it can cause weight loss, loss of bladder control, passing out, and rib fractures from severe coughing. 2. Tdap vaccine Tdap is only for children 7 years and older, adolescents, and adults.  Adolescents should receive a single dose of Tdap, preferably at age 80 or 3 years. Pregnant people should get a dose of Tdap during every pregnancy, preferably during the early part of the third trimester, to help protect the newborn from pertussis. Infants are most at risk for severe, life-threatening complications from pertussis. Adults who have never received Tdap should get a dose of Tdap. Also, adults should receive a booster dose of either Tdap or Td (a different vaccine that protects against tetanus and diphtheria but not pertussis) every 10 years, or after 5 years in the case of a severe or dirty wound or burn. Tdap may be given at  the same time as other vaccines. 3. Talk with your health care provider Tell your vaccine provider if the person getting the vaccine: Has had an allergic reaction after a previous dose of any vaccine that protects against tetanus, diphtheria, or pertussis, or has any severe, life-threatening allergies Has had a coma, decreased level of consciousness, or prolonged seizures within 7 days after a previous dose of any pertussis vaccine (DTP, DTaP, or Tdap) Has seizures or another nervous system problem Has ever had Guillain-Barr Syndrome (also called "GBS") Has had severe pain or swelling after a previous dose of any vaccine that protects against tetanus or diphtheria In some cases, your health care provider may decide to postpone Tdap vaccination until a future visit. People with minor illnesses, such as a cold, may be vaccinated. People who are moderately or severely ill should usually wait until they recover before getting Tdap vaccine.  Your health care provider can give you more information. 4. Risks of a vaccine reaction Pain, redness, or swelling where the shot was given, mild fever, headache, feeling tired, and nausea, vomiting, diarrhea, or stomachache sometimes happen after Tdap vaccination. People sometimes faint after medical procedures, including vaccination. Tell your provider if you feel dizzy or have vision changes or ringing in the ears.  As with any medicine, there is a very remote chance of a vaccine causing a severe allergic reaction, other serious injury, or death. 5. What if there is a serious problem? An allergic reaction could occur after the vaccinated person leaves the clinic. If you see signs of a severe allergic reaction (hives, swelling of the face and throat, difficulty breathing, a  fast heartbeat, dizziness, or weakness), call 9-1-1 and get the person to the nearest hospital. For other signs that concern you, call your health care provider.  Adverse reactions should be  reported to the Vaccine Adverse Event Reporting System (VAERS). Your health care provider will usually file this report, or you can do it yourself. Visit the VAERS website at www.vaers.SamedayNews.es or call 702-391-6450. VAERS is only for reporting reactions, and VAERS staff members do not give medical advice. 6. The National Vaccine Injury Compensation Program The [employer] Vaccine Injury Compensation Program (VICP) is a federal program that was created to compensate people who may have been injured by certain vaccines. Claims regarding alleged injury or death due to vaccination have a time limit for filing, which may be as short as two years. Visit the VICP website at GoldCloset.com.ee or call 925 805 8317 to learn about the program and about filing a claim. 7. How can I learn more? Ask your health care provider. Call your local or state health department. Visit the website of the Food and Drug Administration (FDA) for vaccine package inserts and additional information at TraderRating.uy. Contact the Centers for Disease Control and Prevention (CDC): Call 707 130 0164 (1-800-CDC-INFO) or Visit CDC's website at http://hunter.com/. Vaccine Information Statement Tdap (Tetanus, Diphtheria, Pertussis) Vaccine (08/19/2019) This information is not intended to replace advice given to you by your health care provider. Make sure you discuss any questions you have with your health care provider. Document Revised: 09/14/2019 Document Reviewed: 09/14/2019 Elsevier Patient Education  2022 [employer].

## 2020-10-16 NOTE — Progress Notes (Signed)
Patient presents today as a nurse visit for a blood pressure check.  Patient states she is taking her medication as prescribed without any side effects/adverse effects. Medication and allergy list reviewed with patient and the pharmacy has been verified.   HA: No Dizziness/lightheadedness: No Fever: No BA: No Weakness/Fatigue: No  Sinus pain/pressure: No  Runny nose: No  ST: No  ShOB: No  CP: No  Palps: No Abd pain: No Dysuria: No  N/V/C/D: No    Vital Signs at:1:37 PM Blood Pressure: 106/60 Pulse: 70 SpO2: 98%    Information shared with Dr. Madilyn Fireman who instructed me to tell pt to decrease losartan to 25 mg daily, continue to monitor BP at home and keep a log, and to schedule a f/u NV in 3 weeks.    Rx for losartan 25 mg sent to [facility] in [place].   Pt aware and verbalized understanding. Pt escorted to check out for scheduling.

## 2020-11-06 ENCOUNTER — Ambulatory Visit: Payer: Medicare Other

## 2020-11-06 NOTE — Progress Notes (Deleted)
Patient presents today as a nurse visit for a blood pressure check.  Patient states {he/she/they:23295} is taking {his/her/their:21314} medication as prescribed without any side effects/adverse effects. Medication and allergy list reviewed with patient and the pharmacy has been verified.   HA: {yes/no:20286} Dizziness/lightheadedness: {yes/no:20286} Fever: {yes/no:20286} BA: {yes/no:20286} Weakness/Fatigue: {yes/no:20286}  Sinus pain/pressure: {yes/no:20286}  Runny nose: {yes/no:20286}  ST: {yes/no:20286}  ShOB: {yes/no:20286}  CP: {yes/no:20286}  Palps: {yes/no:20286} Abd pain: {yes/no:20286} Dysuria: {yes/no:20286}  N/V/C/D: {yes/no:20286}    Vital Signs at ***:*** ***M Blood Pressure: *** Pulse: *** SpO2: ***%  Vital Signs at ***:*** ***M Blood Pressure: *** Pulse: *** SpO2: ***%   Information shared with *** who instructed me to tell pt to ***, continue to monitor BP at home and keep a log, and to schedule a f/u ***V in *** months.   Pt aware and verbalized understanding. Pt escorted to check out for scheduling.

## 2020-11-19 ENCOUNTER — Other Ambulatory Visit: Payer: Self-pay

## 2020-11-19 ENCOUNTER — Ambulatory Visit (INDEPENDENT_AMBULATORY_CARE_PROVIDER_SITE_OTHER): Payer: Medicare Other | Admitting: Family Medicine

## 2020-11-19 VITALS — BP 114/63 | HR 76 | Temp 97.8°F | Wt 152.1 lb

## 2020-11-19 DIAGNOSIS — I10 Essential (primary) hypertension: Secondary | ICD-10-CM | POA: Diagnosis not present

## 2020-11-19 NOTE — Progress Notes (Signed)
Patient is here for a blood pressure check. Denies chest pains, palpitations, dizziness, lightheadedness, SOB, blurry vision, trouble sleeping or medication problems.   Patient's blood pressure reading was in range. Patient had a home wrist monitor to compare blood pressure readings.   Clinic's blood pressure monitor: 114/63 (76)  Patient's monitor: 117/67 (78)  Patient advised to schedule next appointment with provider as requested.

## 2020-11-20 NOTE — Progress Notes (Signed)
Agree with documentation as above.   Daveigh Batty, MD  

## 2021-01-03 DIAGNOSIS — U071 COVID-19: Secondary | ICD-10-CM | POA: Diagnosis not present

## 2021-01-10 ENCOUNTER — Encounter: Payer: Self-pay | Admitting: Family Medicine

## 2021-01-10 DIAGNOSIS — K219 Gastro-esophageal reflux disease without esophagitis: Secondary | ICD-10-CM

## 2021-01-10 MED ORDER — OMEPRAZOLE 20 MG PO CPDR: 20.0000 mg | DELAYED_RELEASE_CAPSULE | Freq: Every day | ORAL | 1 refills | Status: DC

## 2021-02-11 ENCOUNTER — Other Ambulatory Visit: Payer: Self-pay | Admitting: Family Medicine

## 2021-03-29 ENCOUNTER — Other Ambulatory Visit: Payer: Self-pay | Admitting: Family Medicine

## 2021-03-29 DIAGNOSIS — I10 Essential (primary) hypertension: Secondary | ICD-10-CM

## 2021-04-03 DIAGNOSIS — Z79811 Long term (current) use of aromatase inhibitors: Secondary | ICD-10-CM | POA: Diagnosis not present

## 2021-04-03 DIAGNOSIS — M85852 Other specified disorders of bone density and structure, left thigh: Secondary | ICD-10-CM | POA: Diagnosis not present

## 2021-04-03 DIAGNOSIS — C50911 Malignant neoplasm of unspecified site of right female breast: Secondary | ICD-10-CM | POA: Diagnosis not present

## 2021-04-03 DIAGNOSIS — Z9189 Other specified personal risk factors, not elsewhere classified: Secondary | ICD-10-CM | POA: Diagnosis not present

## 2021-04-03 DIAGNOSIS — Z170 Estrogen receptor positive status [ER+]: Secondary | ICD-10-CM | POA: Diagnosis not present

## 2021-04-03 DIAGNOSIS — Z13820 Encounter for screening for osteoporosis: Secondary | ICD-10-CM | POA: Diagnosis not present

## 2021-04-03 DIAGNOSIS — Z1231 Encounter for screening mammogram for malignant neoplasm of breast: Secondary | ICD-10-CM | POA: Diagnosis not present

## 2021-04-03 DIAGNOSIS — I10 Essential (primary) hypertension: Secondary | ICD-10-CM | POA: Diagnosis not present

## 2021-04-03 DIAGNOSIS — Z86711 Personal history of pulmonary embolism: Secondary | ICD-10-CM | POA: Diagnosis not present

## 2021-04-03 DIAGNOSIS — D7589 Other specified diseases of blood and blood-forming organs: Secondary | ICD-10-CM | POA: Diagnosis not present

## 2021-04-03 DIAGNOSIS — Z1379 Encounter for other screening for genetic and chromosomal anomalies: Secondary | ICD-10-CM | POA: Diagnosis not present

## 2021-04-03 DIAGNOSIS — Z5181 Encounter for therapeutic drug level monitoring: Secondary | ICD-10-CM | POA: Diagnosis not present

## 2021-04-12 DIAGNOSIS — C50911 Malignant neoplasm of unspecified site of right female breast: Secondary | ICD-10-CM | POA: Diagnosis not present

## 2021-04-12 DIAGNOSIS — Z170 Estrogen receptor positive status [ER+]: Secondary | ICD-10-CM | POA: Diagnosis not present

## 2021-04-22 DIAGNOSIS — C50911 Malignant neoplasm of unspecified site of right female breast: Secondary | ICD-10-CM | POA: Diagnosis not present

## 2021-04-22 DIAGNOSIS — Z170 Estrogen receptor positive status [ER+]: Secondary | ICD-10-CM | POA: Diagnosis not present

## 2021-04-22 DIAGNOSIS — Z1379 Encounter for other screening for genetic and chromosomal anomalies: Secondary | ICD-10-CM | POA: Diagnosis not present

## 2021-05-11 ENCOUNTER — Other Ambulatory Visit: Payer: Self-pay | Admitting: Family Medicine

## 2021-05-13 NOTE — Telephone Encounter (Signed)
LVM advising patient she is due for fasting labs for refill of her meds. AMUCK ?

## 2021-05-13 NOTE — Telephone Encounter (Signed)
Please call pt and advise her that she will need Fasting labs for refill of her cholesterol medication  ?

## 2021-07-08 ENCOUNTER — Other Ambulatory Visit: Payer: Self-pay | Admitting: Family Medicine

## 2021-07-08 DIAGNOSIS — K219 Gastro-esophageal reflux disease without esophagitis: Secondary | ICD-10-CM

## 2021-08-11 ENCOUNTER — Other Ambulatory Visit: Payer: Self-pay | Admitting: Family Medicine

## 2021-09-09 ENCOUNTER — Ambulatory Visit (INDEPENDENT_AMBULATORY_CARE_PROVIDER_SITE_OTHER): Payer: Medicare Other | Admitting: Family Medicine

## 2021-09-09 VITALS — BP 124/70 | HR 71 | Ht 65.0 in | Wt 151.0 lb

## 2021-09-09 DIAGNOSIS — Z0000 Encounter for general adult medical examination without abnormal findings: Secondary | ICD-10-CM | POA: Diagnosis not present

## 2021-09-09 NOTE — Patient Instructions (Addendum)
[place] Maintenance Summary and Written Plan of Care  Kendra [name] ,  Thank you for allowing me to perform your Medicare Annual Wellness Visit and for your ongoing commitment to your health.   Health Maintenance & Immunization History Health Maintenance  Topic Date Due   COVID-19 Vaccine (4 - Pfizer risk series) 09/25/2021 (Originally 01/23/2020)   Zoster Vaccines- Shingrix (1 of 2) 12/10/2021 (Originally 01/08/1960)   INFLUENZA VACCINE  04/13/2022 (Originally 08/13/2021)   Hepatitis C Screening  09/10/2022 (Originally 01/08/1959)   COLONOSCOPY (Pts 45-42yr Insurance coverage will need to be confirmed)  05/31/2022   TETANUS/TDAP  10/17/2030   Pneumonia Vaccine 79 Years old  Completed   DEXA SCAN  Completed   HPV VACCINES  Aged Out   Immunization History  Administered Date(s) Administered   Fluad Quad(high Dose 79+) 09/18/2020   Influenza Split 12/15/2011   Influenza Whole 10/22/2008   Influenza, High Dose Seasonal PF 12/14/2012, 11/15/2015, 11/13/2016, 10/02/2017, 10/25/2018   Influenza,inj,Quad PF,6+ Mos 09/08/2013, 11/15/2014   PFIZER(Purple Top)SARS-COV-2 Vaccination 03/23/2019, 04/13/2019, 11/28/2019   Pneumococcal Conjugate-13 09/08/2013   Pneumococcal Polysaccharide-23 11/23/2008   Tdap 01/13/2010, 10/16/2020   Zoster, Live 01/13/2010    These are the patient goals that we discussed:  Goals Addressed               This Visit's Progress     Patient Stated (pt-stated)        She would like to work on her posture.         This is a list of Health Maintenance Items that are overdue or due now: Influenza vaccine Bone densitometry screening Shingrix vaccine  Bone density and Mammogram is scheduled via [facility] at the Oncologist office.   She would like to wait until the end September.     Orders/Referrals Placed Today: No orders of the defined types were placed in this encounter.  (Contact our referral department at  [phone] you have not spoken with someone about your referral appointment within the next 5 days)    Follow-up Plan Follow-up with MHali Marry MD as planned Schedule your appointment at the pharmacy for your Shingrix vaccine.  Medicare wellness visit in one year.  AVS printed and given to the patient.      Health Maintenance, Female Adopting a healthy lifestyle and getting preventive care are important in promoting health and wellness. Ask your health care provider about: The right schedule for you to have regular tests and exams. Things you can do on your own to prevent diseases and keep yourself healthy. What should I know about diet, weight, and exercise? Eat a healthy diet  Eat a diet that includes plenty of vegetables, fruits, low-fat dairy products, and lean protein. Do not eat a lot of foods that are high in solid fats, added sugars, or sodium. Maintain a healthy weight Body mass index (BMI) is used to identify weight problems. It estimates body fat based on height and weight. Your health care provider can help determine your BMI and help you achieve or maintain a healthy weight. Get regular exercise Get regular exercise. This is one of the most important things you can do for your health. Most adults should: Exercise for at least 150 minutes each week. The exercise should increase your heart rate and make you sweat (moderate-intensity exercise). Do strengthening exercises at least twice a week. This is in addition to the moderate-intensity exercise. Spend less time sitting. Even light physical activity can be  beneficial. Watch cholesterol and blood lipids Have your blood tested for lipids and cholesterol at 79 years of age, then have this test every 5 years. Have your cholesterol levels checked more often if: Your lipid or cholesterol levels are high. You are older than 79 years of age. You are at high risk for heart disease. What should I know about cancer  screening? Depending on your health history and family history, you may need to have cancer screening at various ages. This may include screening for: Breast cancer. Cervical cancer. Colorectal cancer. Skin cancer. Lung cancer. What should I know about heart disease, diabetes, and high blood pressure? Blood pressure and heart disease High blood pressure causes heart disease and increases the risk of stroke. This is more likely to develop in people who have high blood pressure readings or are overweight. Have your blood pressure checked: Every 3-5 years if you are 81-62 years of age. Every year if you are 67 years old or older. Diabetes Have regular diabetes screenings. This checks your fasting blood sugar level. Have the screening done: Once every three years after age 2 if you are at a normal weight and have a low risk for diabetes. More often and at a younger age if you are overweight or have a high risk for diabetes. What should I know about preventing infection? Hepatitis B If you have a higher risk for hepatitis B, you should be screened for this virus. Talk with your health care provider to find out if you are at risk for hepatitis B infection. Hepatitis C Testing is recommended for: Everyone born from [birth year] through 1963. Anyone with known risk factors for hepatitis C. Sexually transmitted infections (STIs) Get screened for STIs, including gonorrhea and chlamydia, if: You are sexually active and are younger than 79 years of age. You are older than 79 years of age and your health care provider tells you that you are at risk for this type of infection. Your sexual activity has changed since you were last screened, and you are at increased risk for chlamydia or gonorrhea. Ask your health care provider if you are at risk. Ask your health care provider about whether you are at high risk for HIV. Your health care provider may recommend a prescription medicine to help prevent HIV  infection. If you choose to take medicine to prevent HIV, you should first get tested for HIV. You should then be tested every 3 months for as long as you are taking the medicine. Pregnancy If you are about to stop having your period (premenopausal) and you may become pregnant, seek counseling before you get pregnant. Take 400 to 800 micrograms (mcg) of folic acid every day if you become pregnant. Ask for birth control (contraception) if you want to prevent pregnancy. Osteoporosis and menopause Osteoporosis is a disease in which the bones lose minerals and strength with aging. This can result in bone fractures. If you are 46 years old or older, or if you are at risk for osteoporosis and fractures, ask your health care provider if you should: Be screened for bone loss. Take a calcium or vitamin D supplement to lower your risk of fractures. Be given hormone replacement therapy (HRT) to treat symptoms of menopause. Follow these instructions at home: Alcohol use Do not drink alcohol if: Your health care provider tells you not to drink. You are pregnant, may be pregnant, or are planning to become pregnant. If you drink alcohol: Limit how much you have to: 0-1 drink  a day. Know how much alcohol is in your drink. In the [place] one drink equals one 12 oz bottle of beer (355 mL), one 5 oz glass of wine (148 mL), or one 1 oz glass of hard liquor (44 mL). Lifestyle Do not use any products that contain nicotine or tobacco. These products include cigarettes, chewing tobacco, and vaping devices, such as e-cigarettes. If you need help quitting, ask your health care provider. Do not use street drugs. Do not share needles. Ask your health care provider for help if you need support or information about quitting drugs. General instructions Schedule regular health, dental, and eye exams. Stay current with your vaccines. Tell your health care provider if: You often feel depressed. You have ever been abused  or do not feel safe at home. Summary Adopting a healthy lifestyle and getting preventive care are important in promoting health and wellness. Follow your health care provider's instructions about healthy diet, exercising, and getting tested or screened for diseases. Follow your health care provider's instructions on monitoring your cholesterol and blood pressure. This information is not intended to replace advice given to you by your health care provider. Make sure you discuss any questions you have with your health care provider. Document Revised: 05/21/2020 Document Reviewed: 05/21/2020 Elsevier Patient Education  [place].

## 2021-09-09 NOTE — Progress Notes (Signed)
MEDICARE ANNUAL WELLNESS VISIT  09/09/2021  Subjective:  Kendra [name] is a 81 y.o. female patient of Metheney, Rene Kocher, MD who had a Medicare Annual Wellness Visit today. [name] is Retired and lives with their spouse. she has 1 child. she reports that she is socially active and does interact with friends/family regularly. she is moderately physically active and enjoys watching her grandson play baseball and staying busy.  Patient Care Team: Hali Marry, MD as PCP - General     09/09/2021   11:06 AM 11/15/2014   10:30 AM  Advanced Directives  Does Patient Have a Medical Advance Directive? Yes Yes  Type of Advance Directive Living will Living will;Healthcare Power of Attorney  Does patient want to make changes to medical advance directive? No - Patient declined No - Patient declined  Copy of [name] in Chart?  No - copy requested    Hospital Utilization Over the Past 12 Months: # of hospitalizations or ER visits: 0 # of surgeries: 0  Review of Systems    Patient reports that her overall health is better when compared to last year.  Review of Systems: History obtained from chart review and the patient  All other systems negative.  Pain Assessment Pain : No/denies pain     Current Medications & Allergies (verified) Allergies as of 09/09/2021       Reactions   Morphine    Morphine And Related Rash        Medication List        Accurate as of September 09, 2021 11:29 AM. If you have any questions, ask your nurse or doctor.          acetaminophen 650 MG CR tablet Commonly known as: TYLENOL Take by mouth.   AMBULATORY NON FORMULARY MEDICATION by Other route as needed. Medication Name: AsperCream Apply to affected joints as needed   aspirin 81 MG tablet Take 81 mg by mouth daily.   atorvastatin 40 MG tablet Commonly known as: LIPITOR Take 1 tablet (40 mg total) by mouth daily. Needs labs   CALCIUM 600+D3 PO Take 1 tablet by  mouth daily.   Calcium Carbonate-Vitamin D 600-200 MG-UNIT Tabs Take 1 tablet by mouth daily.   cetirizine 10 MG tablet Commonly known as: ZYRTEC Take by mouth.   diclofenac sodium 1 % Gel Commonly known as: VOLTAREN APPLY 4 GRAMS TO AFFECTED JOINT FOUR TIMES DAILY What changed: See the new instructions.   docusate sodium 100 MG capsule Commonly known as: COLACE Take 100 mg by mouth 2 (two) times daily.   famotidine 10 MG tablet Commonly known as: PEPCID Take by mouth.   FIBER SELECT GUMMIES PO Take by mouth.   fluticasone 50 MCG/ACT nasal spray Commonly known as: FLONASE SHAKE LIQUID AND USE 1 SPRAY IN EACH NOSTRIL EVERY DAY   hydrochlorothiazide 25 MG tablet Commonly known as: HYDRODIURIL TAKE 1 TABLET BY MOUTH EVERY DAY   icosapent Ethyl 1 g capsule Commonly known as: VASCEPA TAKE 2 CAPSULES( 2 GM TOTAL) BY MOUTH 2 TIMES DAILY   letrozole 2.5 MG tablet Commonly known as: FEMARA Take 2.5 mg by mouth daily.   losartan 25 MG tablet Commonly known as: COZAAR TAKE 1 TABLET BY MOUTH EVERY DAY   omeprazole 20 MG capsule Commonly known as: PRILOSEC TAKE 1 CAPSULE BY MOUTH EVERY DAY What changed:  how much to take how to take this when to take this additional instructions   polyethylene glycol 17 g packet  Commonly known as: MIRALAX / GLYCOLAX Take 17 g by mouth daily.        History (reviewed): Past Medical History:  Diagnosis Date   Allergy    Arthritis    Cancer ([place])    Cataract    GERD (gastroesophageal reflux disease)    High cholesterol    Hypertension    Past Surgical History:  Procedure Laterality Date   ABDOMINAL HYSTERECTOMY     BREAST SURGERY     KNEE SURGERY     Family History  Problem Relation Age of Onset   Heart disease Mother    Hearing loss Father    Stroke Father    Arthritis Sister    Cancer Sister    Social History   Socioeconomic History   Marital status: Married    Spouse name: Heino   Number of children: 1    Years of education: 13   Highest education level: Some college, no degree  Occupational History   Occupation: Retired  Tobacco Use   Smoking status: Former    Packs/day: 1.50    Years: 10.00    Total pack years: 15.00    Types: Cigarettes    Quit date: 02/14/1975    Years since quitting: 46.6   Smokeless tobacco: Never   Tobacco comments:    quit 30 years ago  Vaping Use   Vaping Use: Never used  Substance and Sexual Activity   Alcohol use: Yes   Drug use: No   Sexual activity: Not Currently  Other Topics Concern   Not on file  Social History Narrative   Lives with her husband. They have one daughter, Kendra [name], who lives in [place]. She enjoys staying busy.   Social Determinants of Health   Financial Resource Strain: Low Risk  (09/07/2021)   Overall Financial Resource Strain (CARDIA)    Difficulty of Paying Living Expenses: Not hard at all  Food Insecurity: No Food Insecurity (09/07/2021)   Hunger Vital Sign    Worried About Running Out of Food in the Last Year: Never true    Ran Out of Food in the Last Year: Never true  Transportation Needs: No Transportation Needs (09/07/2021)   PRAPARE - Hydrologist (Medical): No    Lack of Transportation (Non-Medical): No  Physical Activity: Insufficiently Active (09/07/2021)   Exercise Vital Sign    Days of Exercise per Week: 4 days    Minutes of Exercise per Session: 20 min  Stress: No Stress Concern Present (09/07/2021)   [place]    Feeling of Stress : Not at all  Social Connections: Moderately Integrated (09/09/2021)   Social Connection and Isolation Panel [NHANES]    Frequency of Communication with Friends and Family: More than three times a week    Frequency of Social Gatherings with Friends and Family: Once a week    Attends Religious Services: Never    Marine scientist or Organizations: Yes    Attends Archivist  Meetings: More than 4 times per year    Marital Status: Married    Activities of Daily Living    09/09/2021   11:07 AM 09/07/2021    2:42 PM  In your present state of health, do you have any difficulty performing the following activities:  Hearing? 1 1  Comment bilateral hearing aids.   Vision?  0  Difficulty concentrating or making decisions?  0  Walking or climbing stairs?  0  Dressing or bathing?  0  Doing errands, shopping?  0  Preparing Food and eating ?  N  Using the Toilet?  N  In the past six months, have you accidently leaked urine?  N  Do you have problems with loss of bowel control?  N  Managing your Medications?  N  Managing your Finances?  N  Housekeeping or managing your Housekeeping?  N    Patient Education/Literacy How often do you need to have someone help you when you read instructions, pamphlets, or other written materials from your doctor or pharmacy?: 1 - Never What is the last grade level you completed in school?: Some college  Exercise Current Exercise Habits: Home exercise routine, Type of exercise: walking, Time (Minutes): 20, Frequency (Times/Week): 4, Weekly Exercise (Minutes/Week): 80, Intensity: Moderate, Exercise limited by: None identified  Diet Patient reports consuming 3 meals a day and 0 snack(s) a day Patient reports that her primary diet is: Regular Patient reports that she does have regular access to food.   Depression Screen    09/09/2021   11:13 AM 09/18/2020    1:57 PM 05/27/2018    9:50 AM 11/13/2017    1:09 PM 11/13/2016   11:25 AM 05/14/2016   11:11 AM 11/15/2015   11:20 AM  PHQ 2/9 Scores  PHQ - 2 Score 0 0 0 0 0 0 0     Fall Risk    09/09/2021   11:13 AM 09/07/2021    2:42 PM 09/18/2020    1:58 PM 03/31/2019    1:40 PM 03/31/2019    1:37 PM  [place] in the past year? 0 0 0 0 0  Number falls in past yr: 0 0 0 0 0  Injury with Fall? 0 0 0 0 0  Risk for fall due to : No Fall Risks   No Fall Risks No Fall Risks  Follow  up Falls evaluation completed  Falls evaluation completed       Objective:   BP 123/70 (BP Location: Left Arm, Patient Position: Sitting, Cuff Size: Normal)   Pulse 71   Ht '5\' 5"'$  (1.651 m)   Wt 151 lb 0.6 oz (68.5 kg)   SpO2 99%   BMI 25.13 kg/m   Last Weight  Most recent update: 09/09/2021 11:01 AM    Weight  68.5 kg (151 lb 0.6 oz)             Body mass index is 25.13 kg/m.  Hearing/Vision  Ellie did not have difficulty with hearing/understanding during the face-to-face interview Xia did not have difficulty with her vision during the face-to-face interview Reports that she has had a formal eye exam by an eye care professional within the past year Reports that she has not had a formal hearing evaluation within the past year  Cognitive Function:    09/09/2021   11:17 AM  6CIT Screen  What Year? 0 points  What month? 0 points  What time? 0 points  Count back from 20 0 points  Months in reverse 0 points  Repeat phrase 0 points  Total Score 0 points    Normal Cognitive Function Screening: Yes (Normal:0-7, Significant for Dysfunction: >8)  Immunization & Health Maintenance Record Immunization History  Administered Date(s) Administered   Fluad Quad(high Dose 65+) 09/18/2020   Influenza Split 12/15/2011   Influenza Whole 10/22/2008   Influenza, High Dose Seasonal PF 12/14/2012, 11/15/2015, 11/13/2016, 10/02/2017, 10/25/2018   Influenza,inj,Quad PF,6+ Mos 09/08/2013,  11/15/2014   PFIZER(Purple Top)SARS-COV-2 Vaccination 03/23/2019, 04/13/2019, 11/28/2019   Pneumococcal Conjugate-13 09/08/2013   Pneumococcal Polysaccharide-23 11/23/2008   Tdap 01/13/2010, 10/16/2020   Zoster, Live 01/13/2010    Health Maintenance  Topic Date Due   COVID-19 Vaccine (4 - Pfizer risk series) 09/25/2021 (Originally 01/23/2020)   Zoster Vaccines- Shingrix (1 of 2) 12/10/2021 (Originally 01/08/1960)   INFLUENZA VACCINE  04/13/2022 (Originally 08/13/2021)   Hepatitis C Screening   09/10/2022 (Originally 01/08/1959)   COLONOSCOPY (Pts 45-58yr Insurance coverage will need to be confirmed)  05/31/2022   TETANUS/TDAP  10/17/2030   Pneumonia Vaccine 79 Years old  Completed   DEXA SCAN  Completed   HPV VACCINES  Aged Out       Assessment  This is a routine wellness examination for JKALYNN DECLERCQ  Health Maintenance: Due or Overdue There are no preventive care reminders to display for this patient.   JRoyetta Crochetdoes not need a referral for Community Assistance: Care Management:   no Social Work:    no Prescription Assistance:  no Nutrition/Diabetes Education:  no   Plan:  Personalized Goals  Goals Addressed               This Visit's Progress     Patient Stated (pt-stated)        She would like to work on her posture.       Personalized Health Maintenance & Screening Recommendations  Influenza vaccine Bone densitometry screening Shingrix vaccine  Bone density and Mammogram is scheduled via [facility] at the Oncologist office.   She would like to wait until the end September.   Lung Cancer Screening Recommended: no (Low Dose CT Chest recommended if Age 271-80years, 30 pack-year currently smoking OR have quit w/in past 15 years) Hepatitis C Screening recommended: yes HIV Screening recommended: no  Advanced Directives: Written information was not given per the patient's request.  Referrals & Orders No orders of the defined types were placed in this encounter.   Follow-up Plan Follow-up with MHali Marry MD as planned Schedule your appointment at the pharmacy for your Shingrix vaccine.  Medicare wellness visit in one year.  AVS printed and given to the patient.   I have personally reviewed and noted the following in the patient's chart:   Medical and social history Use of alcohol, tobacco or illicit drugs  Current medications and supplements Functional ability and status Nutritional status Physical activity Advanced  directives List of other physicians Hospitalizations, surgeries, and ER visits in previous 12 months Vitals Screenings to include cognitive, depression, and falls Referrals and appointments  In addition, I have reviewed and discussed with patient certain preventive protocols, quality metrics, and best practice recommendations. A written personalized care plan for preventive services as well as general preventive health recommendations were provided to patient.     BTinnie Gens RN BSN  09/09/2021

## 2021-09-22 ENCOUNTER — Other Ambulatory Visit: Payer: Self-pay | Admitting: Family Medicine

## 2021-09-22 DIAGNOSIS — I10 Essential (primary) hypertension: Secondary | ICD-10-CM

## 2021-09-23 ENCOUNTER — Other Ambulatory Visit: Payer: Self-pay | Admitting: Family Medicine

## 2021-09-24 NOTE — Telephone Encounter (Signed)
Called patient and advised overdue labs and office visit for bp check. Patient stated they would call and schedule after their Oncologist visit in October.

## 2021-09-24 NOTE — Telephone Encounter (Signed)
Please advise pt that she is overdue to have labs and OV for bp check done. Will send her refill for now.

## 2021-10-02 DIAGNOSIS — M85852 Other specified disorders of bone density and structure, left thigh: Secondary | ICD-10-CM | POA: Diagnosis not present

## 2021-10-02 DIAGNOSIS — M85851 Other specified disorders of bone density and structure, right thigh: Secondary | ICD-10-CM | POA: Diagnosis not present

## 2021-10-03 DIAGNOSIS — Z1231 Encounter for screening mammogram for malignant neoplasm of breast: Secondary | ICD-10-CM | POA: Diagnosis not present

## 2021-10-16 DIAGNOSIS — C50911 Malignant neoplasm of unspecified site of right female breast: Secondary | ICD-10-CM | POA: Diagnosis not present

## 2021-10-16 DIAGNOSIS — Z1379 Encounter for other screening for genetic and chromosomal anomalies: Secondary | ICD-10-CM | POA: Diagnosis not present

## 2021-10-16 DIAGNOSIS — Z86711 Personal history of pulmonary embolism: Secondary | ICD-10-CM | POA: Diagnosis not present

## 2021-10-16 DIAGNOSIS — Z5112 Encounter for antineoplastic immunotherapy: Secondary | ICD-10-CM | POA: Diagnosis not present

## 2021-10-16 DIAGNOSIS — Z79811 Long term (current) use of aromatase inhibitors: Secondary | ICD-10-CM | POA: Diagnosis not present

## 2021-10-16 DIAGNOSIS — Z9189 Other specified personal risk factors, not elsewhere classified: Secondary | ICD-10-CM | POA: Diagnosis not present

## 2021-10-16 DIAGNOSIS — Z170 Estrogen receptor positive status [ER+]: Secondary | ICD-10-CM | POA: Diagnosis not present

## 2021-10-16 DIAGNOSIS — M85852 Other specified disorders of bone density and structure, left thigh: Secondary | ICD-10-CM | POA: Diagnosis not present

## 2021-10-16 DIAGNOSIS — I10 Essential (primary) hypertension: Secondary | ICD-10-CM | POA: Diagnosis not present

## 2021-10-16 DIAGNOSIS — Z5181 Encounter for therapeutic drug level monitoring: Secondary | ICD-10-CM | POA: Diagnosis not present

## 2021-11-09 ENCOUNTER — Other Ambulatory Visit: Payer: Self-pay | Admitting: Family Medicine

## 2021-11-18 DIAGNOSIS — Z23 Encounter for immunization: Secondary | ICD-10-CM | POA: Diagnosis not present

## 2021-12-23 ENCOUNTER — Telehealth: Payer: Self-pay | Admitting: Family Medicine

## 2021-12-23 NOTE — Telephone Encounter (Signed)
Please call pt and have her schedule a f/u for BP and labs. Ty

## 2021-12-23 NOTE — Telephone Encounter (Signed)
Called patient, spoke with patients spouse, spouse will relay message for patient to call office to schedule.

## 2021-12-27 DIAGNOSIS — H5203 Hypermetropia, bilateral: Secondary | ICD-10-CM | POA: Diagnosis not present

## 2021-12-27 DIAGNOSIS — H25813 Combined forms of age-related cataract, bilateral: Secondary | ICD-10-CM | POA: Diagnosis not present

## 2022-01-17 ENCOUNTER — Ambulatory Visit (INDEPENDENT_AMBULATORY_CARE_PROVIDER_SITE_OTHER): Payer: Medicare Other | Admitting: Family Medicine

## 2022-01-17 ENCOUNTER — Encounter: Payer: Self-pay | Admitting: Family Medicine

## 2022-01-17 VITALS — BP 112/67 | HR 66 | Ht 65.0 in | Wt 151.0 lb

## 2022-01-17 DIAGNOSIS — N1831 Chronic kidney disease, stage 3a: Secondary | ICD-10-CM

## 2022-01-17 DIAGNOSIS — Z170 Estrogen receptor positive status [ER+]: Secondary | ICD-10-CM | POA: Diagnosis not present

## 2022-01-17 DIAGNOSIS — I10 Essential (primary) hypertension: Secondary | ICD-10-CM

## 2022-01-17 DIAGNOSIS — C50911 Malignant neoplasm of unspecified site of right female breast: Secondary | ICD-10-CM | POA: Diagnosis not present

## 2022-01-17 DIAGNOSIS — E785 Hyperlipidemia, unspecified: Secondary | ICD-10-CM

## 2022-01-17 DIAGNOSIS — E041 Nontoxic single thyroid nodule: Secondary | ICD-10-CM | POA: Diagnosis not present

## 2022-01-17 DIAGNOSIS — M810 Age-related osteoporosis without current pathological fracture: Secondary | ICD-10-CM | POA: Diagnosis not present

## 2022-01-17 HISTORY — DX: Age-related osteoporosis without current pathological fracture: M81.0

## 2022-01-17 NOTE — Assessment & Plan Note (Signed)
Due to recheck lipids. 

## 2022-01-17 NOTE — Progress Notes (Signed)
   Established Patient Office Visit  Subjective   Patient ID: Kendra [name], female    DOB: 1940/04/25  Age: 82 y.o. MRN: [id number]  Chief Complaint  Patient presents with   Hypertension    HPI  Hypertension- Pt denies chest pain, SOB, dizziness, or heart palpitations.  Taking meds as directed w/o problems.  Denies medication side effects.    F/U CKD 3 -taking losartan 25 mg daily.  Tolerating the medication well without any side effects or problems.  She does have some chronic constipation so takes Colace and says she eats grapes and apples every morning for breakfast and that seems to keep her pretty well-regulated.  Osteoporosis-she is on Prolia injections with her oncologist.    ROS    Objective:     BP 112/67   Pulse 66   Ht '5\' 5"'$  (1.651 m)   Wt 151 lb (68.5 kg)   SpO2 100%   BMI 25.13 kg/m    Physical Exam Vitals and nursing note reviewed.  Constitutional:      Appearance: She is well-developed.  HENT:     Head: Normocephalic and atraumatic.  Cardiovascular:     Rate and Rhythm: Normal rate and regular rhythm.     Heart sounds: Normal heart sounds.  Pulmonary:     Effort: Pulmonary effort is normal.     Breath sounds: Normal breath sounds.  Skin:    General: Skin is warm and dry.  Neurological:     Mental Status: She is alert and oriented to person, place, and time.  Psychiatric:        Behavior: Behavior normal.     No results found for any visits on 01/17/22.    The 10-year ASCVD risk score (Arnett DK, et al., 2019) is: 24.5%    Assessment & Plan:   Problem List Items Addressed This Visit       Cardiovascular and Mediastinum   Essential hypertension, benign   Relevant Orders   TSH   Lipid panel   Urine Microalbumin w/creat. ratio   COMPLETE METABOLIC PANEL WITH GFR     Endocrine   THYROID NODULE   Relevant Orders   TSH   Lipid panel     Musculoskeletal and Integument   Osteoporosis    Continue Prolia.  T-score of -1.1.   She just had her DEXA scan updated in September.        Genitourinary   CKD (chronic kidney disease) stage 3, GFR 30-59 ml/min (HCC)    You to follow renal function every 6 months.  It looks like she has been getting a CBC drawn with oncology but no recent renal function so we will get that drawn and updated today.  Also check for excess protein.      Relevant Orders   TSH   Lipid panel   Urine Microalbumin w/creat. ratio   COMPLETE METABOLIC PANEL WITH GFR     Other   Malignant neoplasm of right breast in female, estrogen receptor positive ([place])    Mammogram recently was clear.      Hyperlipidemia - Primary    Due to recheck lipids.        Relevant Orders   TSH   Lipid panel    Return in about 6 months (around 07/18/2022) for Hypertension.    Beatrice Lecher, MD

## 2022-01-17 NOTE — Assessment & Plan Note (Signed)
You to follow renal function every 6 months.  It looks like she has been getting a CBC drawn with oncology but no recent renal function so we will get that drawn and updated today.  Also check for excess protein.

## 2022-01-17 NOTE — Assessment & Plan Note (Addendum)
Continue Prolia.  T-score of -1.1.  She just had her DEXA scan updated in September.

## 2022-01-17 NOTE — Assessment & Plan Note (Signed)
Mammogram recently was clear.

## 2022-01-18 LAB — LIPID PANEL
Cholesterol: 184 mg/dL (ref <200)
HDL: 66 mg/dL (ref >=50)
LDL Cholesterol (Calc): 93 mg/dL (calc)
Non-HDL Cholesterol (Calc): 118 mg/dL (calc) (ref <130)
Total CHOL/HDL Ratio: 2.8 (calc) (ref <5.0)
Triglycerides: 145 mg/dL (ref <150)

## 2022-01-18 LAB — COMPLETE METABOLIC PANEL WITH GFR
AG Ratio: 1.9 (calc) (ref 1.0–2.5)
ALT: 21 U/L (ref 6–29)
AST: 17 U/L (ref 10–35)
Albumin: 4.3 g/dL (ref 3.6–5.1)
Alkaline phosphatase (APISO): 53 U/L (ref 37–153)
BUN: 17 mg/dL (ref 7–25)
CO2: 29 mmol/L (ref 20–32)
Calcium: 10.4 mg/dL (ref 8.6–10.4)
Chloride: 100 mmol/L (ref 98–110)
Creat: 0.85 mg/dL (ref 0.60–1.00)
Globulin: 2.3 g/dL (calc) (ref 1.9–3.7)
Glucose, Bld: 92 mg/dL (ref 65–99)
Potassium: 4.3 mmol/L (ref 3.5–5.3)
Sodium: 140 mmol/L (ref 135–146)
Total Bilirubin: 0.6 mg/dL (ref 0.2–1.2)
Total Protein: 6.6 g/dL (ref 6.1–8.1)
eGFR: 70 mL/min/{1.73_m2} (ref >=60)

## 2022-01-18 LAB — MICROALBUMIN / CREATININE URINE RATIO
Creatinine, Urine: 31 mg/dL (ref 20–275)
Microalb, Ur: <0.2 mg/dL

## 2022-01-18 LAB — TSH: TSH: 1.04 mIU/L (ref 0.40–4.50)

## 2022-01-20 NOTE — Progress Notes (Signed)
HI Kendra [name],  Your labs look great!!

## 2022-01-24 ENCOUNTER — Other Ambulatory Visit: Payer: Self-pay | Admitting: Family Medicine

## 2022-02-13 ENCOUNTER — Other Ambulatory Visit: Payer: Self-pay | Admitting: Family Medicine

## 2022-03-19 DIAGNOSIS — H25813 Combined forms of age-related cataract, bilateral: Secondary | ICD-10-CM | POA: Diagnosis not present

## 2022-03-19 DIAGNOSIS — H353132 Nonexudative age-related macular degeneration, bilateral, intermediate dry stage: Secondary | ICD-10-CM | POA: Diagnosis not present

## 2022-03-19 DIAGNOSIS — H35373 Puckering of macula, bilateral: Secondary | ICD-10-CM | POA: Diagnosis not present

## 2022-03-19 DIAGNOSIS — H52223 Regular astigmatism, bilateral: Secondary | ICD-10-CM | POA: Diagnosis not present

## 2022-03-24 ENCOUNTER — Other Ambulatory Visit: Payer: Self-pay | Admitting: Family Medicine

## 2022-03-24 DIAGNOSIS — I10 Essential (primary) hypertension: Secondary | ICD-10-CM

## 2022-04-01 DIAGNOSIS — H25813 Combined forms of age-related cataract, bilateral: Secondary | ICD-10-CM | POA: Diagnosis not present

## 2022-04-08 DIAGNOSIS — I10 Essential (primary) hypertension: Secondary | ICD-10-CM | POA: Diagnosis not present

## 2022-04-08 DIAGNOSIS — K219 Gastro-esophageal reflux disease without esophagitis: Secondary | ICD-10-CM | POA: Diagnosis not present

## 2022-04-08 DIAGNOSIS — Z853 Personal history of malignant neoplasm of breast: Secondary | ICD-10-CM | POA: Diagnosis not present

## 2022-04-08 DIAGNOSIS — Z885 Allergy status to narcotic agent status: Secondary | ICD-10-CM | POA: Diagnosis not present

## 2022-04-08 DIAGNOSIS — H5203 Hypermetropia, bilateral: Secondary | ICD-10-CM | POA: Diagnosis not present

## 2022-04-08 DIAGNOSIS — Z79899 Other long term (current) drug therapy: Secondary | ICD-10-CM | POA: Diagnosis not present

## 2022-04-08 DIAGNOSIS — H25811 Combined forms of age-related cataract, right eye: Secondary | ICD-10-CM | POA: Diagnosis not present

## 2022-04-08 DIAGNOSIS — J302 Other seasonal allergic rhinitis: Secondary | ICD-10-CM | POA: Diagnosis not present

## 2022-04-08 DIAGNOSIS — H25813 Combined forms of age-related cataract, bilateral: Secondary | ICD-10-CM | POA: Diagnosis not present

## 2022-04-08 DIAGNOSIS — E785 Hyperlipidemia, unspecified: Secondary | ICD-10-CM | POA: Diagnosis not present

## 2022-04-15 DIAGNOSIS — Z79899 Other long term (current) drug therapy: Secondary | ICD-10-CM | POA: Diagnosis not present

## 2022-04-15 DIAGNOSIS — Z853 Personal history of malignant neoplasm of breast: Secondary | ICD-10-CM | POA: Diagnosis not present

## 2022-04-15 DIAGNOSIS — E785 Hyperlipidemia, unspecified: Secondary | ICD-10-CM | POA: Diagnosis not present

## 2022-04-15 DIAGNOSIS — H25813 Combined forms of age-related cataract, bilateral: Secondary | ICD-10-CM | POA: Diagnosis not present

## 2022-04-15 DIAGNOSIS — J302 Other seasonal allergic rhinitis: Secondary | ICD-10-CM | POA: Diagnosis not present

## 2022-04-15 DIAGNOSIS — H25812 Combined forms of age-related cataract, left eye: Secondary | ICD-10-CM | POA: Diagnosis not present

## 2022-04-15 DIAGNOSIS — K219 Gastro-esophageal reflux disease without esophagitis: Secondary | ICD-10-CM | POA: Diagnosis not present

## 2022-04-15 DIAGNOSIS — I10 Essential (primary) hypertension: Secondary | ICD-10-CM | POA: Diagnosis not present

## 2022-04-15 DIAGNOSIS — Z885 Allergy status to narcotic agent status: Secondary | ICD-10-CM | POA: Diagnosis not present

## 2022-04-15 DIAGNOSIS — H5203 Hypermetropia, bilateral: Secondary | ICD-10-CM | POA: Diagnosis not present

## 2022-05-14 DIAGNOSIS — H838X3 Other specified diseases of inner ear, bilateral: Secondary | ICD-10-CM | POA: Diagnosis not present

## 2022-05-14 DIAGNOSIS — H903 Sensorineural hearing loss, bilateral: Secondary | ICD-10-CM | POA: Diagnosis not present

## 2022-05-28 DIAGNOSIS — M85852 Other specified disorders of bone density and structure, left thigh: Secondary | ICD-10-CM | POA: Diagnosis not present

## 2022-05-28 DIAGNOSIS — I10 Essential (primary) hypertension: Secondary | ICD-10-CM | POA: Diagnosis not present

## 2022-05-28 DIAGNOSIS — Z5181 Encounter for therapeutic drug level monitoring: Secondary | ICD-10-CM | POA: Diagnosis not present

## 2022-05-28 DIAGNOSIS — Z9189 Other specified personal risk factors, not elsewhere classified: Secondary | ICD-10-CM | POA: Diagnosis not present

## 2022-05-28 DIAGNOSIS — Z79818 Long term (current) use of other agents affecting estrogen receptors and estrogen levels: Secondary | ICD-10-CM | POA: Diagnosis not present

## 2022-05-28 DIAGNOSIS — Z5112 Encounter for antineoplastic immunotherapy: Secondary | ICD-10-CM | POA: Diagnosis not present

## 2022-05-28 DIAGNOSIS — D7589 Other specified diseases of blood and blood-forming organs: Secondary | ICD-10-CM | POA: Diagnosis not present

## 2022-05-28 DIAGNOSIS — Z1379 Encounter for other screening for genetic and chromosomal anomalies: Secondary | ICD-10-CM | POA: Diagnosis not present

## 2022-05-28 DIAGNOSIS — C50911 Malignant neoplasm of unspecified site of right female breast: Secondary | ICD-10-CM | POA: Diagnosis not present

## 2022-05-28 DIAGNOSIS — Z86711 Personal history of pulmonary embolism: Secondary | ICD-10-CM | POA: Diagnosis not present

## 2022-05-28 DIAGNOSIS — Z170 Estrogen receptor positive status [ER+]: Secondary | ICD-10-CM | POA: Diagnosis not present

## 2022-05-28 DIAGNOSIS — Z1231 Encounter for screening mammogram for malignant neoplasm of breast: Secondary | ICD-10-CM | POA: Diagnosis not present

## 2022-05-28 DIAGNOSIS — Z79811 Long term (current) use of aromatase inhibitors: Secondary | ICD-10-CM | POA: Diagnosis not present

## 2022-06-10 DIAGNOSIS — C50911 Malignant neoplasm of unspecified site of right female breast: Secondary | ICD-10-CM | POA: Diagnosis not present

## 2022-06-10 DIAGNOSIS — Z170 Estrogen receptor positive status [ER+]: Secondary | ICD-10-CM | POA: Diagnosis not present

## 2022-06-11 DIAGNOSIS — C50911 Malignant neoplasm of unspecified site of right female breast: Secondary | ICD-10-CM | POA: Diagnosis not present

## 2022-06-11 DIAGNOSIS — Z170 Estrogen receptor positive status [ER+]: Secondary | ICD-10-CM | POA: Diagnosis not present

## 2022-07-18 ENCOUNTER — Ambulatory Visit: Payer: Medicare Other | Admitting: Family Medicine

## 2022-08-17 ENCOUNTER — Other Ambulatory Visit: Payer: Self-pay | Admitting: Family Medicine

## 2022-09-17 ENCOUNTER — Other Ambulatory Visit: Payer: Self-pay | Admitting: Family Medicine

## 2022-09-17 DIAGNOSIS — K219 Gastro-esophageal reflux disease without esophagitis: Secondary | ICD-10-CM

## 2022-09-27 ENCOUNTER — Other Ambulatory Visit: Payer: Self-pay | Admitting: Family Medicine

## 2022-09-27 DIAGNOSIS — I10 Essential (primary) hypertension: Secondary | ICD-10-CM

## 2022-10-01 NOTE — Telephone Encounter (Signed)
Please advise Kendra [name] that she will need to schedule a f/u appt with Dr. Linford Arnold for BP for refills of this medication.

## 2022-10-01 NOTE — Telephone Encounter (Signed)
Called patient, LVM to call office to make f/u appt for BP meds, thanks.

## 2022-10-16 ENCOUNTER — Ambulatory Visit (INDEPENDENT_AMBULATORY_CARE_PROVIDER_SITE_OTHER): Payer: Medicare Other | Admitting: Family Medicine

## 2022-10-16 VITALS — BP 123/73 | HR 77 | Ht 65.0 in | Wt 150.0 lb

## 2022-10-16 DIAGNOSIS — N1831 Chronic kidney disease, stage 3a: Secondary | ICD-10-CM | POA: Diagnosis not present

## 2022-10-16 DIAGNOSIS — E785 Hyperlipidemia, unspecified: Secondary | ICD-10-CM | POA: Diagnosis not present

## 2022-10-16 DIAGNOSIS — D539 Nutritional anemia, unspecified: Secondary | ICD-10-CM

## 2022-10-16 DIAGNOSIS — Z23 Encounter for immunization: Secondary | ICD-10-CM | POA: Diagnosis not present

## 2022-10-16 DIAGNOSIS — M810 Age-related osteoporosis without current pathological fracture: Secondary | ICD-10-CM | POA: Diagnosis not present

## 2022-10-16 DIAGNOSIS — I10 Essential (primary) hypertension: Secondary | ICD-10-CM | POA: Diagnosis not present

## 2022-10-16 DIAGNOSIS — J301 Allergic rhinitis due to pollen: Secondary | ICD-10-CM

## 2022-10-16 MED ORDER — FLUTICASONE PROPIONATE 50 MCG/ACT NA SUSP: 1.0000 | Freq: Every day | NASAL | 3 refills | Status: AC

## 2022-10-16 NOTE — Assessment & Plan Note (Signed)
Continue daily statin. 

## 2022-10-16 NOTE — Assessment & Plan Note (Signed)
Well controlled. Continue current regimen. Follow up in  6 mo  

## 2022-10-16 NOTE — Progress Notes (Signed)
Established Patient Office Visit  Subjective   Patient ID: Kendra [name], female    DOB: 1940-10-30  Age: 82 y.o. MRN: [id number]  Chief Complaint  Patient presents with   Medical Management of Chronic Issues    6 moth fup on htn    HPI Has had bilateral cataract surgery since I last saw her.   Had mammo schedule for next week.    Hypertension- Pt denies chest pain, SOB, dizziness, or heart palpitations.  Taking meds as directed w/o problems.  Denies medication side effects.    Hyperlipidemia - tolerating stating well with no myalgias or significant side effects.  Lab Results  Component Value Date   CHOL 184 01/17/2022   HDL 66 01/17/2022   LDLCALC 93 01/17/2022   LDLDIRECT 111 (H) 11/24/2008   TRIG 145 01/17/2022   CHOLHDL 2.8 01/17/2022   No longer on Letrizole now that she is in remission.       ROS    Objective:     BP 123/73   Pulse 77   Ht 5\' 5"  (1.651 m)   Wt 150 lb (68 kg)   SpO2 100%   BMI 24.96 kg/m    Physical Exam Vitals and nursing note reviewed.  Constitutional:      Appearance: Normal appearance.  HENT:     Head: Normocephalic and atraumatic.  Eyes:     Conjunctiva/sclera: Conjunctivae normal.  Cardiovascular:     Rate and Rhythm: Normal rate and regular rhythm.  Pulmonary:     Effort: Pulmonary effort is normal.     Breath sounds: Normal breath sounds.  Skin:    General: Skin is warm and dry.  Neurological:     Mental Status: She is alert.  Psychiatric:        Mood and Affect: Mood normal.     No results found for any visits on 10/16/22.    The 10-year ASCVD risk score (Arnett DK, et al., 2019) is: 30%    Assessment & Plan:   Problem List Items Addressed This Visit       Cardiovascular and Mediastinum   Essential hypertension, benign    Well controlled. Continue current regimen. Follow up in  34mo       Relevant Orders   CMP14+EGFR   B12 and Folate Panel     Respiratory   Allergic rhinitis    Hasn't had to  use her nasal spray in a couple of year but her fall allergies are flaring.         Musculoskeletal and Integument   Osteoporosis    On Prolia with Dr. Abbe Amsterdam. Next injection on Nov        Genitourinary   CKD (chronic kidney disease) stage 3, GFR 30-59 ml/min (HCC)    Continue to follow renal function Q 34mo         Other   Hyperlipidemia    Continue daily statin      Other Visit Diagnoses     Immunization due    -  Primary   Relevant Orders   Flu Vaccine Trivalent High Dose (Fluad) (Completed)   Macrocytic anemia       Relevant Orders   CMP14+EGFR   B12 and Folate Panel       Flu vac given.   Return in about 6 months (around 04/16/2023) for Hypertension.    Nani Gasser, MD

## 2022-10-16 NOTE — Assessment & Plan Note (Signed)
On Prolia with Dr. Abbe Amsterdam. Next injection on Nov

## 2022-10-16 NOTE — Assessment & Plan Note (Signed)
Continue to follow renal function Q 6 mo.

## 2022-10-16 NOTE — Assessment & Plan Note (Signed)
Hasn't had to use her nasal spray in a couple of year but her fall allergies are flaring.

## 2022-10-17 LAB — CMP14+EGFR
ALT: 18 [IU]/L (ref 0–32)
AST: 18 [IU]/L (ref 0–40)
Albumin: 4.1 g/dL (ref 3.8–4.8)
Alkaline Phosphatase: 61 [IU]/L (ref 44–121)
BUN/Creatinine Ratio: 19 (ref 12–28)
BUN: 18 mg/dL (ref 8–27)
Bilirubin Total: 0.4 mg/dL (ref 0.0–1.2)
CO2: 24 mmol/L (ref 20–29)
Calcium: 10.1 mg/dL (ref 8.7–10.3)
Chloride: 100 mmol/L (ref 96–106)
Creatinine, Ser: 0.96 mg/dL (ref 0.57–1.00)
Globulin, Total: 2.5 g/dL (ref 1.5–4.5)
Glucose: 89 mg/dL (ref 70–99)
Potassium: 3.9 mmol/L (ref 3.5–5.2)
Sodium: 139 mmol/L (ref 134–144)
Total Protein: 6.6 g/dL (ref 6.0–8.5)
eGFR: 60 mL/min/{1.73_m2} (ref >59)

## 2022-10-17 LAB — B12 AND FOLATE PANEL
Folate: 8.4 ng/mL (ref >3.0)
Vitamin B-12: 376 pg/mL (ref 232–1245)

## 2022-10-17 NOTE — Progress Notes (Signed)
Hi Tracy, metabolic panel looks great.  Vitamin B12 and folate are normal.

## 2022-10-21 DIAGNOSIS — Z1231 Encounter for screening mammogram for malignant neoplasm of breast: Secondary | ICD-10-CM | POA: Diagnosis not present

## 2022-10-21 DIAGNOSIS — R92323 Mammographic fibroglandular density, bilateral breasts: Secondary | ICD-10-CM | POA: Diagnosis not present

## 2022-10-26 ENCOUNTER — Other Ambulatory Visit: Payer: Self-pay | Admitting: Family Medicine

## 2022-10-26 DIAGNOSIS — I10 Essential (primary) hypertension: Secondary | ICD-10-CM

## 2022-11-17 DIAGNOSIS — H5203 Hypermetropia, bilateral: Secondary | ICD-10-CM | POA: Diagnosis not present

## 2022-11-17 DIAGNOSIS — H43813 Vitreous degeneration, bilateral: Secondary | ICD-10-CM | POA: Diagnosis not present

## 2022-11-17 DIAGNOSIS — Z961 Presence of intraocular lens: Secondary | ICD-10-CM | POA: Diagnosis not present

## 2022-11-20 ENCOUNTER — Encounter: Payer: Self-pay | Admitting: Family Medicine

## 2022-11-20 DIAGNOSIS — M810 Age-related osteoporosis without current pathological fracture: Secondary | ICD-10-CM

## 2022-11-20 MED ORDER — DENOSUMAB 60 MG/ML ~~LOC~~ SOSY
60.0000 mg | PREFILLED_SYRINGE | Freq: Once | SUBCUTANEOUS | Status: AC
Start: 2022-12-01 — End: 2022-12-01
  Administered 2022-12-01: 60 mg via SUBCUTANEOUS

## 2022-11-20 NOTE — Telephone Encounter (Signed)
Ok, order placed.  Do we need go get in stock for her to get? Lab are up to date  [facility] to schedule her

## 2022-12-01 ENCOUNTER — Ambulatory Visit (INDEPENDENT_AMBULATORY_CARE_PROVIDER_SITE_OTHER): Payer: Medicare Other

## 2022-12-01 DIAGNOSIS — M810 Age-related osteoporosis without current pathological fracture: Secondary | ICD-10-CM | POA: Diagnosis not present

## 2022-12-01 MED ORDER — DENOSUMAB 60 MG/ML ~~LOC~~ SOSY
60.0000 mg | PREFILLED_SYRINGE | Freq: Once | SUBCUTANEOUS | Status: AC
Start: 2023-06-01 — End: 2023-05-28
  Administered 2023-05-28: 60 mg via SUBCUTANEOUS

## 2022-12-01 NOTE — Progress Notes (Signed)
   Established Patient Office Visit  Subjective   Patient ID: Kendra [name], female    DOB: Dec 28, 1940  Age: 82 y.o. MRN: [id number]  Chief Complaint  Patient presents with   Osteoporosis    HPI  Kendra [name] is here for Prolia injection.    ROS    Objective:     There were no vitals taken for this visit.   Physical Exam   No results found for any visits on 12/01/22.    The 10-year ASCVD risk score (Arnett DK, et al., 2019) is: 30%    Assessment & Plan:  Prolia injection - Patient tolerated injection well without complications. Patient advised to schedule next injection 06/01/2023.  Problem List Items Addressed This Visit       Unprioritized   Osteoporosis - Primary   Relevant Medications   denosumab (PROLIA) injection 60 mg (Start on 06/01/2023 10:00 AM)    Return in about 26 weeks (around 06/01/2023) for Prolia injection. Earna Coder, Janalyn Harder, CMA

## 2023-02-13 ENCOUNTER — Other Ambulatory Visit: Payer: Self-pay | Admitting: Family Medicine

## 2023-04-16 ENCOUNTER — Ambulatory Visit: Payer: Medicare Other | Admitting: Family Medicine

## 2023-05-03 ENCOUNTER — Other Ambulatory Visit: Payer: Self-pay | Admitting: Family Medicine

## 2023-05-03 DIAGNOSIS — I10 Essential (primary) hypertension: Secondary | ICD-10-CM

## 2023-05-15 ENCOUNTER — Telehealth: Payer: Self-pay

## 2023-05-15 NOTE — Telephone Encounter (Signed)
 Prolia  VOB initiated via [email or web address]  Next Prolia  inj DUE: 05/31/23

## 2023-05-19 NOTE — Telephone Encounter (Signed)
 Pt ready for scheduling for PROLIA  on or after : 05/31/23  Option# 1: Buy/Bill (Office supplied medication)  Out-of-pocket cost due at time of clinic visit: $257  Number of injection/visits approved: ---  Primary: MEDICARE Prolia  co-insurance: 0% Admin fee co-insurance: 0%  Secondary: BCBS[place]-MEDSUP Prolia  co-insurance: This plan does not cover the Medicare Part B deductible Admin fee co-insurance:   Medical Benefit Details: Date Benefits were checked: 05/19/23 Deductible: $0 Met of $257 Required/ Coinsurance: 0%/ Admin Fee: 0%  Prior Auth: N/A PA# Expiration Date:   # of doses approved: ----------------------------------------------------------------------- Option# 2- Med Obtained from pharmacy:  Pharmacy benefit: Copay $--- (Paid to pharmacy) Admin Fee: --- (Pay at clinic)  Prior Auth: --- PA# Expiration Date:   # of doses approved:   If patient wants fill through the pharmacy benefit please send prescription to:  --- , and include estimated need by date in rx notes. Pharmacy will ship medication directly to the office.  Patient NOT eligible for Prolia  Copay Card. Copay Card can make patient's cost as little as $25. Link to apply: [email or web address]://[email or web address]  ** This summary of benefits is an estimation of the patient's out-of-pocket cost. Exact cost may very based on individual plan coverage.

## 2023-05-19 NOTE — Telephone Encounter (Signed)
 Kendra [name]

## 2023-05-25 ENCOUNTER — Encounter (HOSPITAL_COMMUNITY): Payer: Self-pay

## 2023-05-28 ENCOUNTER — Ambulatory Visit (INDEPENDENT_AMBULATORY_CARE_PROVIDER_SITE_OTHER)

## 2023-05-28 VITALS — BP 118/68 | HR 66 | Temp 98.6°F | Resp 16 | Ht 65.0 in

## 2023-05-28 DIAGNOSIS — M810 Age-related osteoporosis without current pathological fracture: Secondary | ICD-10-CM | POA: Diagnosis not present

## 2023-05-28 MED ORDER — DENOSUMAB 60 MG/ML ~~LOC~~ SOSY
60.0000 mg | PREFILLED_SYRINGE | Freq: Once | SUBCUTANEOUS | Status: AC
Start: 2023-10-28 — End: ?

## 2023-05-28 NOTE — Progress Notes (Signed)
 Pt is  her for Prolia  injection , pt repots she is taking calcium  and vitamin D  daily,Pt's calcium  and kidney function was within normal limits  Injection was tolerated well by the patient. (See MAR for injection details) ,At the time injection pt reports she would like to receive a bill for her co-pay, she was not able to make a payment at time of her visit.

## 2023-06-01 ENCOUNTER — Ambulatory Visit: Payer: Medicare Other

## 2023-06-01 ENCOUNTER — Encounter: Payer: Self-pay | Admitting: Family Medicine

## 2023-06-01 ENCOUNTER — Ambulatory Visit (INDEPENDENT_AMBULATORY_CARE_PROVIDER_SITE_OTHER): Admitting: Family Medicine

## 2023-06-01 VITALS — BP 125/64 | HR 74 | Ht 65.0 in | Wt 152.0 lb

## 2023-06-01 DIAGNOSIS — E041 Nontoxic single thyroid nodule: Secondary | ICD-10-CM

## 2023-06-01 DIAGNOSIS — C50911 Malignant neoplasm of unspecified site of right female breast: Secondary | ICD-10-CM

## 2023-06-01 DIAGNOSIS — Z170 Estrogen receptor positive status [ER+]: Secondary | ICD-10-CM | POA: Diagnosis not present

## 2023-06-01 DIAGNOSIS — E785 Hyperlipidemia, unspecified: Secondary | ICD-10-CM

## 2023-06-01 DIAGNOSIS — N1831 Chronic kidney disease, stage 3a: Secondary | ICD-10-CM

## 2023-06-01 DIAGNOSIS — K449 Diaphragmatic hernia without obstruction or gangrene: Secondary | ICD-10-CM

## 2023-06-01 DIAGNOSIS — I10 Essential (primary) hypertension: Secondary | ICD-10-CM

## 2023-06-01 NOTE — Assessment & Plan Note (Signed)
 Due to recheck renal function. And urine micro

## 2023-06-01 NOTE — Assessment & Plan Note (Addendum)
 Follows with Dr. Wayne Haines. She needs to call to schedule her yearly appt.

## 2023-06-01 NOTE — Assessment & Plan Note (Signed)
 Well-controlled.  Due for some updated lab work.

## 2023-06-01 NOTE — Progress Notes (Signed)
   Established Patient Office Visit  Subjective  Patient ID: Kendra [name], female    DOB: Aug 09, 1940  Age: 83 y.o. MRN: [id number]  Chief Complaint  Patient presents with   Hypertension    HPI  Hypertension- Pt denies chest pain, SOB, dizziness, or heart palpitations.  Taking meds as directed w/o problems.  Denies medication side effects.    She is really dong well.    Her left shoulder is sore from her Prolia  injection from Friday.  He does take her calcium  and vitamin D  as well.  She has always had a little discomfort after the injection but this time it is little bit more sore than usual.  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     ROS    Objective:     BP 125/64   Pulse 74   Ht 5\' 5"  (1.651 m)   Wt 152 lb (68.9 kg)   SpO2 100%   BMI 25.29 kg/m    Physical Exam Vitals and nursing note reviewed.  Constitutional:      Appearance: Normal appearance.  HENT:     Head: Normocephalic and atraumatic.  Eyes:     Conjunctiva/sclera: Conjunctivae normal.  Cardiovascular:     Rate and Rhythm: Normal rate and regular rhythm.  Pulmonary:     Effort: Pulmonary effort is normal.     Breath sounds: Normal breath sounds.  Skin:    General: Skin is warm and dry.  Neurological:     Mental Status: She is alert.  Psychiatric:        Mood and Affect: Mood normal.     No results found for any visits on 06/01/23.    The ASCVD Risk score (Arnett DK, et al., 2019) failed to calculate for the following reasons:   The 2019 ASCVD risk score is only valid for ages 32 to 57    Assessment & Plan:   Problem List Items Addressed This Visit       Cardiovascular and Mediastinum   Essential hypertension, benign - Primary   Well-controlled.  Due for some updated lab work.        Relevant Orders   BMP8+EGFR   Lipid Panel With LDL/HDL Ratio   TSH   Urine Microalbumin w/creat. ratio   Lipid panel     Respiratory   Hiatal hernia   ON PPI EOD and H2 blocker  nightly.         Endocrine   THYROID  NODULE   Relevant Orders   BMP8+EGFR   Lipid Panel With LDL/HDL Ratio   TSH   Urine Microalbumin w/creat. ratio   Lipid panel     Genitourinary   CKD (chronic kidney disease) stage 3, GFR 30-59 ml/min (HCC)   Due to recheck renal function. And urine micro      Relevant Orders   BMP8+EGFR   Lipid Panel With LDL/HDL Ratio   TSH   Urine Microalbumin w/creat. ratio   Lipid panel     Other   Malignant neoplasm of right breast in female, estrogen receptor positive (HCC)   Follows with Dr. Wayne Haines. She needs to call to schedule her yearly appt.       Hyperlipidemia   Relevant Orders   BMP8+EGFR   Lipid Panel With LDL/HDL Ratio   TSH   Urine Microalbumin w/creat. ratio   Lipid panel    Return in about 6 months (around 12/02/2023) for Hypertension, labs .    Duaine German, MD

## 2023-06-01 NOTE — Assessment & Plan Note (Signed)
 ON PPI EOD and H2 blocker nightly.

## 2023-06-02 ENCOUNTER — Ambulatory Visit: Payer: Self-pay | Admitting: Family Medicine

## 2023-06-02 LAB — LIPID PANEL
Chol/HDL Ratio: 3 ratio (ref 0.0–4.4)
Cholesterol, Total: 152 mg/dL (ref 100–199)
HDL: 50 mg/dL (ref >39)
LDL Chol Calc (NIH): 83 mg/dL (ref 0–99)
Triglycerides: 103 mg/dL (ref 0–149)
VLDL Cholesterol Cal: 19 mg/dL (ref 5–40)

## 2023-06-02 LAB — BMP8+EGFR
BUN/Creatinine Ratio: 24 (ref 12–28)
BUN: 22 mg/dL (ref 8–27)
CO2: 24 mmol/L (ref 20–29)
Calcium: 10.3 mg/dL (ref 8.7–10.3)
Chloride: 99 mmol/L (ref 96–106)
Creatinine, Ser: 0.93 mg/dL (ref 0.57–1.00)
Glucose: 92 mg/dL (ref 70–99)
Potassium: 4 mmol/L (ref 3.5–5.2)
Sodium: 138 mmol/L (ref 134–144)
eGFR: 62 mL/min/{1.73_m2} (ref >59)

## 2023-06-02 LAB — MICROALBUMIN / CREATININE URINE RATIO
Creatinine, Urine: 53.4 mg/dL
Microalb/Creat Ratio: 19 mg/g{creat} (ref 0–29)
Microalbumin, Urine: 9.9 ug/mL

## 2023-06-02 LAB — LIPID PANEL WITH LDL/HDL RATIO: LDL/HDL Ratio: 1.7 ratio (ref 0.0–3.2)

## 2023-06-02 LAB — TSH: TSH: 1.05 u[IU]/mL (ref 0.450–4.500)

## 2023-06-02 NOTE — Progress Notes (Signed)
 Your lab work is within acceptable range and there are no concerning findings.   ?

## 2023-08-07 ENCOUNTER — Other Ambulatory Visit: Payer: Self-pay | Admitting: Family Medicine

## 2023-09-02 DIAGNOSIS — Z5181 Encounter for therapeutic drug level monitoring: Secondary | ICD-10-CM | POA: Diagnosis not present

## 2023-09-02 DIAGNOSIS — Z1231 Encounter for screening mammogram for malignant neoplasm of breast: Secondary | ICD-10-CM | POA: Diagnosis not present

## 2023-09-02 DIAGNOSIS — D7589 Other specified diseases of blood and blood-forming organs: Secondary | ICD-10-CM | POA: Diagnosis not present

## 2023-09-02 DIAGNOSIS — Z86711 Personal history of pulmonary embolism: Secondary | ICD-10-CM | POA: Diagnosis not present

## 2023-09-02 DIAGNOSIS — I10 Essential (primary) hypertension: Secondary | ICD-10-CM | POA: Diagnosis not present

## 2023-09-02 DIAGNOSIS — Z170 Estrogen receptor positive status [ER+]: Secondary | ICD-10-CM | POA: Diagnosis not present

## 2023-09-02 DIAGNOSIS — Z1379 Encounter for other screening for genetic and chromosomal anomalies: Secondary | ICD-10-CM | POA: Diagnosis not present

## 2023-09-02 DIAGNOSIS — C50911 Malignant neoplasm of unspecified site of right female breast: Secondary | ICD-10-CM | POA: Diagnosis not present

## 2023-09-02 DIAGNOSIS — Z79811 Long term (current) use of aromatase inhibitors: Secondary | ICD-10-CM | POA: Diagnosis not present

## 2023-09-02 DIAGNOSIS — M8589 Other specified disorders of bone density and structure, multiple sites: Secondary | ICD-10-CM | POA: Diagnosis not present

## 2023-09-02 DIAGNOSIS — Z13820 Encounter for screening for osteoporosis: Secondary | ICD-10-CM | POA: Diagnosis not present

## 2023-09-02 DIAGNOSIS — Z9189 Other specified personal risk factors, not elsewhere classified: Secondary | ICD-10-CM | POA: Diagnosis not present

## 2023-09-10 ENCOUNTER — Other Ambulatory Visit: Payer: Self-pay | Admitting: Family Medicine

## 2023-09-10 DIAGNOSIS — K219 Gastro-esophageal reflux disease without esophagitis: Secondary | ICD-10-CM

## 2023-10-28 ENCOUNTER — Telehealth: Payer: Self-pay

## 2023-10-28 ENCOUNTER — Other Ambulatory Visit (HOSPITAL_COMMUNITY): Payer: Self-pay

## 2023-10-28 NOTE — Telephone Encounter (Signed)
 SABRA

## 2023-10-28 NOTE — Telephone Encounter (Signed)
 Pt ready for scheduling for PROLIA  on or after : 11/28/23  Option# 1: Buy/Bill (Office supplied medication)  Out-of-pocket cost due at time of clinic visit: $0  Number of injection/visits approved: ---  Primary: MEDICARE Prolia  co-insurance: 0% Admin fee co-insurance: 0%  Secondary: [employer] Prolia  co-insurance:  Admin fee co-insurance:   Medical Benefit Details: Date Benefits were checked: 10/28/23 Deductible: $257 Met of $257 Required/ Coinsurance: 0%/ Admin Fee: 0%  Prior Auth: N/A PA# Expiration Date:   # of doses approved: ----------------------------------------------------------------------- Option# 2- Med Obtained from pharmacy:  Pharmacy benefit: Copay $--- (Paid to pharmacy) Admin Fee: --- (Pay at clinic)  Prior Auth: --- PA# Expiration Date:   # of doses approved:   If patient wants fill through the pharmacy benefit please send prescription to: ---, and include estimated need by date in rx notes. Pharmacy will ship medication directly to the office.  Patient NOT eligible for Prolia  Copay Card. Copay Card can make patient's cost as little as $25. Link to apply: [email or web address]://[email or web address]  ** This summary of benefits is an estimation of the patient's out-of-pocket cost. Exact cost may very based on individual plan coverage.

## 2023-10-28 NOTE — Telephone Encounter (Signed)
 Prolia  VOB initiated via [email or web address]  Next Prolia  inj DUE: 11/28/23

## 2023-11-13 ENCOUNTER — Other Ambulatory Visit: Payer: Self-pay | Admitting: Family Medicine

## 2023-11-13 DIAGNOSIS — I10 Essential (primary) hypertension: Secondary | ICD-10-CM

## 2023-11-19 DIAGNOSIS — H35363 Drusen (degenerative) of macula, bilateral: Secondary | ICD-10-CM | POA: Diagnosis not present

## 2023-11-19 DIAGNOSIS — Z961 Presence of intraocular lens: Secondary | ICD-10-CM | POA: Diagnosis not present

## 2023-11-19 DIAGNOSIS — H43813 Vitreous degeneration, bilateral: Secondary | ICD-10-CM | POA: Diagnosis not present

## 2023-12-02 ENCOUNTER — Telehealth: Payer: Self-pay | Admitting: Family Medicine

## 2023-12-02 ENCOUNTER — Ambulatory Visit (INDEPENDENT_AMBULATORY_CARE_PROVIDER_SITE_OTHER): Admitting: Family Medicine

## 2023-12-02 ENCOUNTER — Encounter: Payer: Self-pay | Admitting: Family Medicine

## 2023-12-02 VITALS — BP 125/61 | HR 72 | Ht 65.0 in | Wt 147.1 lb

## 2023-12-02 DIAGNOSIS — R239 Unspecified skin changes: Secondary | ICD-10-CM | POA: Diagnosis not present

## 2023-12-02 DIAGNOSIS — I10 Essential (primary) hypertension: Secondary | ICD-10-CM

## 2023-12-02 DIAGNOSIS — Z23 Encounter for immunization: Secondary | ICD-10-CM | POA: Diagnosis not present

## 2023-12-02 DIAGNOSIS — N1831 Chronic kidney disease, stage 3a: Secondary | ICD-10-CM | POA: Diagnosis not present

## 2023-12-02 MED ORDER — HYDROCHLOROTHIAZIDE 25 MG PO TABS: 25.0000 mg | ORAL_TABLET | Freq: Every day | ORAL | 3 refills | Status: AC

## 2023-12-02 NOTE — Progress Notes (Signed)
 Established Patient Office Visit  Patient ID: Kendra [name], female    DOB: December 13, 1940  Age: 83 y.o. MRN: [id number] PCP: Alvan Dorothyann BIRCH, MD  Chief Complaint  Patient presents with   Hypertension    Subjective:     HPI  Discussed the use of AI scribe software for clinical note transcription with the patient, who gave verbal consent to proceed.  History of Present Illness Kendra [name] is an 83 year old female who presents with a skin lesion on her scalp.  Scalp lesion - Dry, crusty lesion located on the frontal scalp - Present for several months - Initially noticed by her hairdresser - Recently started bleeding, possibly due to scratching during the night - A similar lesion had previously resolved spontaneously  Thyroid  nodule - History of thyroid  nodule - No new symptoms or discomfort related to thyroid   Blood pressure monitoring - Occasionally checks blood pressure at home - Home blood pressure readings have been normal  Medication tolerance and adherence - No issues with current medications, all of which are generic - No recent changes in medication regimen - Previously took prescription fish oil but switched to generic medications due to cost - No notifications about changes in medication coverage     ROS    Objective:     BP 125/61   Pulse 72   Ht 5' 5 (1.651 m)   Wt 147 lb 1.6 oz (66.7 kg)   SpO2 100%   BMI 24.48 kg/m    Physical Exam Vitals and nursing note reviewed.  Constitutional:      Appearance: Normal appearance.  HENT:     Head: Normocephalic and atraumatic.  Eyes:     Conjunctiva/sclera: Conjunctivae normal.  Cardiovascular:     Rate and Rhythm: Normal rate and regular rhythm.  Pulmonary:     Effort: Pulmonary effort is normal.     Breath sounds: Normal breath sounds.  Skin:    General: Skin is warm and dry.     Comments: Keratotic hornlike lesion on top of scalp and 2nd similar lesion just slightlyanterior on  scalp, just not hornlike   Neurological:     Mental Status: She is alert.  Psychiatric:        Mood and Affect: Mood normal.      No results found for any visits on 12/02/23.    The ASCVD Risk score (Arnett DK, et al., 2019) failed to calculate for the following reasons:   The 2019 ASCVD risk score is only valid for ages 104 to 48    Assessment & Plan:   Problem List Items Addressed This Visit       Cardiovascular and Mediastinum   Essential hypertension, benign    Essential hypertension Blood pressure well-controlled with home monitoring.      Relevant Medications   hydrochlorothiazide  (HYDRODIURIL ) 25 MG tablet   Other Relevant Orders   CMP14+EGFR     Genitourinary   CKD (chronic kidney disease) stage 3, GFR 30-59 ml/min (HCC)   Chronic kidney disease, stage 3a Chronic kidney disease stage 3a, well-managed. - Ordered blood work to monitor kidney function.      Relevant Orders   CMP14+EGFR   Other Visit Diagnoses       Encounter for immunization    -  Primary   Relevant Orders   Flu vaccine HIGH DOSE PF(Fluzone Trivalent) (Completed)     Suspected skin cancer       Relevant Orders   Ambulatory  referral to Dermatology       Assessment and Plan Assessment & Plan Skin lesion of scalp, possible squamous cell carcinoma Lesion on scalp suggestive of squamous cell carcinoma. Differential includes actinic keratosis. Biopsy required for diagnosis and excision if necessary. - Referred to [facility] for evaluation and biopsy. - Advised to contact if appointment is beyond early January.  Immunization counseling and preventive care Discussed shingles vaccination, recommending Shingrix. RSV vaccine recommended for those 75 and over, but she declined. COVID vaccine not necessary due to low exposure risk. - Encouraged Shingrix vaccine at pharmacy. - Advised to discuss RSV vaccine with pharmacist if interested. - Mammo and DEXA schedule for next week.     Return in about 6 months (around 05/31/2024) for Hypertension.    Dorothyann Byars, MD [facility] at [facility]

## 2023-12-02 NOTE — Assessment & Plan Note (Signed)
  Essential hypertension Blood pressure well-controlled with home monitoring.

## 2023-12-02 NOTE — Telephone Encounter (Signed)
 Left patient a voicemail regarding her questions about prolia  billing. Her deductible was $257 which she met on 05/28/23 when she had her prolia  injection. Her next injection on 10/28/23 was covered at 100%. If the patient has anymore question she can be transferred cover to me or send me a CRM to call the patient back.

## 2023-12-02 NOTE — Assessment & Plan Note (Signed)
 Chronic kidney disease, stage 3a Chronic kidney disease stage 3a, well-managed. - Ordered blood work to monitor kidney function.

## 2023-12-02 NOTE — Progress Notes (Signed)
 Pt has a place on her scalp that she would like to have looked at.

## 2023-12-03 ENCOUNTER — Ambulatory Visit: Payer: Self-pay | Admitting: Family Medicine

## 2023-12-03 LAB — CMP14+EGFR
ALT: 17 IU/L (ref 0–32)
AST: 19 IU/L (ref 0–40)
Albumin: 4.2 g/dL (ref 3.8–4.8)
Alkaline Phosphatase: 64 IU/L (ref 49–135)
BUN/Creatinine Ratio: 21 (ref 12–28)
BUN: 18 mg/dL (ref 8–27)
Bilirubin Total: 0.5 mg/dL (ref 0.0–1.2)
CO2: 24 mmol/L (ref 20–29)
Calcium: 10.6 mg/dL — ABNORMAL HIGH (ref 8.7–10.3)
Chloride: 101 mmol/L (ref 96–106)
Creatinine, Ser: 0.87 mg/dL (ref 0.57–1.00)
Globulin, Total: 2.5 g/dL (ref 1.5–4.5)
Glucose: 106 mg/dL — ABNORMAL HIGH (ref 70–99)
Potassium: 3.6 mmol/L (ref 3.5–5.2)
Sodium: 140 mmol/L (ref 134–144)
Total Protein: 6.7 g/dL (ref 6.0–8.5)
eGFR: 67 mL/min/1.73 (ref >59)

## 2023-12-03 NOTE — Progress Notes (Signed)
 Your lab work is within acceptable range and there are no concerning findings.   ?

## 2023-12-08 DIAGNOSIS — Z13820 Encounter for screening for osteoporosis: Secondary | ICD-10-CM | POA: Diagnosis not present

## 2023-12-08 DIAGNOSIS — M8589 Other specified disorders of bone density and structure, multiple sites: Secondary | ICD-10-CM | POA: Diagnosis not present

## 2023-12-08 DIAGNOSIS — Z1231 Encounter for screening mammogram for malignant neoplasm of breast: Secondary | ICD-10-CM | POA: Diagnosis not present

## 2023-12-08 DIAGNOSIS — R92323 Mammographic fibroglandular density, bilateral breasts: Secondary | ICD-10-CM | POA: Diagnosis not present

## 2024-02-17 ENCOUNTER — Other Ambulatory Visit: Payer: Self-pay | Admitting: Family Medicine

## 2024-06-01 ENCOUNTER — Ambulatory Visit: Admitting: Family Medicine
